# Patient Record
Sex: Male | Born: 1970 | Hispanic: No | Smoking: Never smoker
Health system: Southern US, Community
[De-identification: ages and names within clinical notes are randomized; demographics above are authoritative.]

## PROBLEM LIST (undated history)

## (undated) DIAGNOSIS — B159 Hepatitis A without hepatic coma: Secondary | ICD-10-CM

## (undated) DIAGNOSIS — J189 Pneumonia, unspecified organism: Secondary | ICD-10-CM

## (undated) DIAGNOSIS — G4730 Sleep apnea, unspecified: Secondary | ICD-10-CM

## (undated) DIAGNOSIS — F329 Major depressive disorder, single episode, unspecified: Secondary | ICD-10-CM

## (undated) DIAGNOSIS — F32A Depression, unspecified: Secondary | ICD-10-CM

## (undated) DIAGNOSIS — J1000 Influenza due to other identified influenza virus with unspecified type of pneumonia: Secondary | ICD-10-CM

## (undated) HISTORY — PX: OTHER SURGICAL HISTORY: SHX169

---

## 2016-04-20 DIAGNOSIS — B159 Hepatitis A without hepatic coma: Secondary | ICD-10-CM

## 2016-04-20 HISTORY — DX: Hepatitis a without hepatic coma: B15.9

## 2016-05-21 DIAGNOSIS — J189 Pneumonia, unspecified organism: Secondary | ICD-10-CM

## 2016-05-21 HISTORY — DX: Pneumonia, unspecified organism: J18.9

## 2016-06-12 ENCOUNTER — Emergency Department (HOSPITAL_COMMUNITY)
Admission: EM | Admit: 2016-06-12 | Discharge: 2016-06-13 | Disposition: A | Discharge status: Other Acute Inpatient Hospital | Payer: 59 | Attending: Emergency Medicine | Admitting: Emergency Medicine

## 2016-06-12 ENCOUNTER — Encounter (HOSPITAL_COMMUNITY): Payer: Self-pay | Admitting: Emergency Medicine

## 2016-06-12 DIAGNOSIS — Z5181 Encounter for therapeutic drug level monitoring: Secondary | ICD-10-CM | POA: Insufficient documentation

## 2016-06-12 DIAGNOSIS — R109 Unspecified abdominal pain: Secondary | ICD-10-CM | POA: Insufficient documentation

## 2016-06-12 DIAGNOSIS — Z87891 Personal history of nicotine dependence: Secondary | ICD-10-CM | POA: Diagnosis not present

## 2016-06-12 DIAGNOSIS — R45851 Suicidal ideations: Secondary | ICD-10-CM

## 2016-06-12 DIAGNOSIS — F329 Major depressive disorder, single episode, unspecified: Secondary | ICD-10-CM | POA: Insufficient documentation

## 2016-06-12 HISTORY — DX: Hepatitis a without hepatic coma: B15.9

## 2016-06-12 LAB — URINALYSIS, ROUTINE W REFLEX MICROSCOPIC
BILIRUBIN URINE: NEGATIVE
Glucose, UA: NEGATIVE mg/dL
HGB URINE DIPSTICK: NEGATIVE
Ketones, ur: NEGATIVE mg/dL
Leukocytes, UA: NEGATIVE
Nitrite: NEGATIVE
Protein, ur: NEGATIVE mg/dL
SPECIFIC GRAVITY, URINE: 1.016 (ref 1.005–1.030)
pH: 6 (ref 5.0–8.0)

## 2016-06-12 LAB — RAPID URINE DRUG SCREEN, HOSP PERFORMED
AMPHETAMINES: NOT DETECTED
Barbiturates: NOT DETECTED
Benzodiazepines: NOT DETECTED
Cocaine: NOT DETECTED
Opiates: NOT DETECTED
TETRAHYDROCANNABINOL: NOT DETECTED

## 2016-06-12 LAB — ACETAMINOPHEN LEVEL: Acetaminophen (Tylenol), Serum: <10 ug/mL — ABNORMAL LOW (ref 10–30)

## 2016-06-12 LAB — ETHANOL: Alcohol, Ethyl (B): <5 mg/dL (ref <5)

## 2016-06-12 LAB — CBC
HEMATOCRIT: 46.8 % (ref 39.0–52.0)
HEMOGLOBIN: 15.8 g/dL (ref 13.0–17.0)
MCH: 29 pg (ref 26.0–34.0)
MCHC: 33.8 g/dL (ref 30.0–36.0)
MCV: 85.9 fL (ref 78.0–100.0)
Platelets: 282 10*3/uL (ref 150–400)
RBC: 5.45 MIL/uL (ref 4.22–5.81)
RDW: 13.8 % (ref 11.5–15.5)
WBC: 9.1 10*3/uL (ref 4.0–10.5)

## 2016-06-12 LAB — SALICYLATE LEVEL: Salicylate Lvl: <7 mg/dL (ref 2.8–30.0)

## 2016-06-12 LAB — COMPREHENSIVE METABOLIC PANEL
ALT: 110 U/L — AB (ref 17–63)
AST: 52 U/L — AB (ref 15–41)
Albumin: 4.1 g/dL (ref 3.5–5.0)
Alkaline Phosphatase: 48 U/L (ref 38–126)
Anion gap: 9 (ref 5–15)
BUN: 13 mg/dL (ref 6–20)
CHLORIDE: 103 mmol/L (ref 101–111)
CO2: 24 mmol/L (ref 22–32)
CREATININE: 0.93 mg/dL (ref 0.61–1.24)
Calcium: 9.5 mg/dL (ref 8.9–10.3)
GFR calc Af Amer: >60 mL/min (ref >60)
GFR calc non Af Amer: >60 mL/min (ref >60)
Glucose, Bld: 168 mg/dL — ABNORMAL HIGH (ref 65–99)
Potassium: 4.2 mmol/L (ref 3.5–5.1)
SODIUM: 136 mmol/L (ref 135–145)
Total Bilirubin: 0.8 mg/dL (ref 0.3–1.2)
Total Protein: 7.2 g/dL (ref 6.5–8.1)

## 2016-06-12 LAB — LIPASE, BLOOD: LIPASE: 31 U/L (ref 11–51)

## 2016-06-12 NOTE — ED Notes (Signed)
Called for sitter ?

## 2016-06-12 NOTE — ED Triage Notes (Signed)
Pt presents to ED for assessment of right sided upper abdominal ache 4/10 starting yesterday.  Patient returned from a business trip from [place] a month ago, was diagnosed with asian flu, then he had pneumonia, followed by a  CT angio to r/o PE which was negative but found he had an enlarged liver.  Pt diagnosed with Hep A.  Being treated by his PCP, but started to have pain and noted his skin was turning more yellow the past two days.  Patient, during screenings, also admitted to have some SI, as he also recently got divorced.

## 2016-06-13 ENCOUNTER — Inpatient Hospital Stay (HOSPITAL_COMMUNITY)
Admission: AD | Admit: 2016-06-13 | Discharge: 2016-06-16 | DRG: 885 | Disposition: A | Discharge status: Home / Self Care | Payer: 59 | Source: Intra-hospital | Attending: Psychiatry | Admitting: Psychiatry

## 2016-06-13 ENCOUNTER — Encounter (HOSPITAL_COMMUNITY): Payer: Self-pay | Admitting: *Deleted

## 2016-06-13 ENCOUNTER — Emergency Department (HOSPITAL_COMMUNITY): Payer: 59

## 2016-06-13 DIAGNOSIS — B159 Hepatitis A without hepatic coma: Secondary | ICD-10-CM | POA: Diagnosis present

## 2016-06-13 DIAGNOSIS — F332 Major depressive disorder, recurrent severe without psychotic features: Secondary | ICD-10-CM | POA: Diagnosis present

## 2016-06-13 DIAGNOSIS — Z79899 Other long term (current) drug therapy: Secondary | ICD-10-CM | POA: Diagnosis not present

## 2016-06-13 DIAGNOSIS — F329 Major depressive disorder, single episode, unspecified: Secondary | ICD-10-CM | POA: Diagnosis not present

## 2016-06-13 DIAGNOSIS — G4730 Sleep apnea, unspecified: Secondary | ICD-10-CM | POA: Diagnosis present

## 2016-06-13 DIAGNOSIS — Z87891 Personal history of nicotine dependence: Secondary | ICD-10-CM | POA: Diagnosis not present

## 2016-06-13 HISTORY — DX: Sleep apnea, unspecified: G47.30

## 2016-06-13 HISTORY — DX: Depression, unspecified: F32.A

## 2016-06-13 HISTORY — DX: Influenza due to other identified influenza virus with unspecified type of pneumonia: J10.00

## 2016-06-13 HISTORY — DX: Pneumonia, unspecified organism: J18.9

## 2016-06-13 HISTORY — DX: Major depressive disorder, single episode, unspecified: F32.9

## 2016-06-13 MED ORDER — ALUM & MAG HYDROXIDE-SIMETH 200-200-20 MG/5ML PO SUSP
30.0000 mL | ORAL | Status: DC | PRN
  Administered 2016-06-13: 30 mL via ORAL
  Filled 2016-06-13: qty 30

## 2016-06-13 MED ORDER — IBUPROFEN 400 MG PO TABS: 600.0000 mg | ORAL_TABLET | Freq: Three times a day (TID) | ORAL | Status: DC | PRN

## 2016-06-13 MED ORDER — ZOLPIDEM TARTRATE 5 MG PO TABS: 5.0000 mg | ORAL_TABLET | Freq: Every evening | ORAL | Status: DC | PRN

## 2016-06-13 MED ORDER — BUPROPION HCL ER (XL) 150 MG PO TB24
300.0000 mg | ORAL_TABLET | Freq: Every day | ORAL | Status: DC
  Administered 2016-06-13: 300 mg via ORAL
  Filled 2016-06-13: qty 2

## 2016-06-13 MED ORDER — ACETAMINOPHEN 325 MG PO TABS: 650.0000 mg | ORAL_TABLET | Freq: Four times a day (QID) | ORAL | Status: DC | PRN

## 2016-06-13 MED ORDER — BUPROPION HCL ER (XL) 300 MG PO TB24
300.0000 mg | ORAL_TABLET | Freq: Every day | ORAL | Status: DC
  Administered 2016-06-14: 300 mg via ORAL
  Filled 2016-06-13 (×3): qty 1

## 2016-06-13 MED ORDER — PREDNISONE 10 MG PO TABS
10.0000 mg | ORAL_TABLET | Freq: Three times a day (TID) | ORAL | Status: AC
  Administered 2016-06-14 (×3): 10 mg via ORAL
  Filled 2016-06-13: qty 2
  Filled 2016-06-13 (×2): qty 1
  Filled 2016-06-13: qty 4
  Filled 2016-06-13: qty 1

## 2016-06-13 MED ORDER — MAGNESIUM HYDROXIDE 400 MG/5ML PO SUSP
30.0000 mL | Freq: Every day | ORAL | Status: DC | PRN
  Administered 2016-06-15: 30 mL via ORAL
  Filled 2016-06-13: qty 30

## 2016-06-13 MED ORDER — TRAZODONE HCL 50 MG PO TABS
50.0000 mg | ORAL_TABLET | Freq: Every evening | ORAL | Status: DC | PRN
  Administered 2016-06-13: 50 mg via ORAL
  Filled 2016-06-13 (×2): qty 1

## 2016-06-13 MED ORDER — ALUM & MAG HYDROXIDE-SIMETH 200-200-20 MG/5ML PO SUSP: 30.0000 mL | ORAL | Status: DC | PRN

## 2016-06-13 MED ORDER — PREDNISONE 20 MG PO TABS
20.0000 mg | ORAL_TABLET | Freq: Once | ORAL | Status: AC
  Administered 2016-06-13: 20 mg via ORAL
  Filled 2016-06-13: qty 1

## 2016-06-13 MED ORDER — PREDNISONE 10 MG PO TABS
10.0000 mg | ORAL_TABLET | Freq: Two times a day (BID) | ORAL | Status: AC
Start: 2016-06-15 — End: 2016-06-15
  Administered 2016-06-15 (×2): 10 mg via ORAL
  Filled 2016-06-13 (×2): qty 1

## 2016-06-13 MED ORDER — PREDNISONE 10 MG PO TABS
10.0000 mg | ORAL_TABLET | Freq: Every day | ORAL | Status: DC
  Administered 2016-06-16: 10 mg via ORAL
  Filled 2016-06-13 (×3): qty 1

## 2016-06-13 MED ORDER — ONDANSETRON HCL 4 MG PO TABS: 4.0000 mg | ORAL_TABLET | Freq: Three times a day (TID) | ORAL | Status: DC | PRN
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
## 2016-06-13 NOTE — ED Notes (Signed)
Pt aware of tx plan - accepted to [facility] - signed consent forms - copy faxed to [facility], copy sent to Medical Records, and original placed in envelope for [facility].

## 2016-06-13 NOTE — ED Notes (Signed)
Pt talking w/a lady named, Rosann Auerbachrish, on phone at nurses' desk.

## 2016-06-13 NOTE — ED Notes (Signed)
States he feels safe to go home. States will sign "No Harm Safety Contract" and feels safe to follow up w/outpt tx - if providers agree to allow him to be d/c'd. States he did cut his wrists a few months ago after moving to this area "for attention". States did not seek tx. States he is planning to move to [place] soon so may be closer to his daughters. States he works for [employer] and they had moved him from his job in [place] to  a job at [employer] in [place] and he travels often. States came to ED because he was concerned about upper right abd pain d/t recently diagnosed w/Hep A - which he says he got from [place] along w/"Asian flu" and pneumonia.

## 2016-06-13 NOTE — ED Notes (Signed)
Taken to xray.

## 2016-06-13 NOTE — Plan of Care (Signed)
Problem: Safety: Goal: Periods of time without injury will increase Outcome: Progressing Patient contracts for safety on the unit. Patient on q15 min safety checks.

## 2016-06-13 NOTE — ED Notes (Signed)
Pt's belongings x 1 labeled personal black bag placed at nurses' desk for inventory. Pt's cell phone placed in bag.

## 2016-06-13 NOTE — Progress Notes (Signed)
Received call [facility] re: [facility] of [facility] and need for precautions. Per ED provider note it has been a month since patient returned from [place], began to feel ill and was diagnosed with a "fllu" possibly "Asian" flu per patient. He subsequently continued to cough and was diagnosed with pneumonia. Has finished a course of antibiotics, no respiratory symptoms, no fever today. He presents with abdominal pain and suicidal thoughts. From an IP standpoint I do not see indications for isolation precautions.

## 2016-06-13 NOTE — Tx Team (Signed)
Initial Treatment Plan 06/13/2016 4:37 PM Arthur Cruz Broda [id number]    PATIENT STRESSORS: Financial difficulties Health problems Marital or family conflict Traumatic event   PATIENT STRENGTHS: Average or above average intelligence Capable of independent living Financial means Supportive family/friends Work skills   PATIENT IDENTIFIED PROBLEMS: Recent loss of marriage  Family conflict  Dx of illness due international travel  Hx of suicide attempt  "feelings of hopelessness"  "not sleeping/worrying"  Suicidal risk         DISCHARGE CRITERIA:  Improved stabilization in mood, thinking, and/or behavior Medical problems require only outpatient monitoring Motivation to continue treatment in a less acute level of care Need for constant or close observation no longer present Verbal commitment to aftercare and medication compliance  PRELIMINARY DISCHARGE PLAN: Outpatient therapy Return to previous living arrangement Return to previous work or school arrangements  PATIENT/FAMILY INVOLVEMENT: This treatment plan has been presented to and reviewed with the patient, Arthur Cruz Salim.  The patient and family have been given the opportunity to ask questions and make suggestions.  Cranford MonBeaudry, Lianette Broussard Evans, RN 06/13/2016, 4:37 PM

## 2016-06-13 NOTE — Progress Notes (Signed)
[facility]. Writer will continue to follow up with placement efforts.  Melbourne Abtsatia Brynlee Pennywell, LCSWA Disposition staff 06/13/2016 10:45 AM

## 2016-06-13 NOTE — ED Notes (Signed)
Pt on phone at nurses' desk w/Trish - advising her he is going [facility].
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
## 2016-06-13 NOTE — ED Provider Notes (Signed)
[facility] DEPT Provider Note   CSN: [id number] Arrival date & time: 06/12/16  2137     History   Chief Complaint Chief Complaint  Patient presents with  . Abdominal Pain  . Suicidal    HPI Arthur Cruz is a 46 y.o. male.  The history is provided by the patient and medical records.    46 year old male here with abdominal pain.  Patient reports about a month ago he went on a business trip to [place] and once he returned he began feeling ill. He was seen and diagnosed with a flu.  States afterwards he continued coughing, was then diagnosed with pneumonia. He states he completed antibiotics and had a CT scan of his chest which was negative for PE and revealed that the pneumonia had cleared. States on the CT scan he was told that his liver was enlarged. He was tested for hepatitis and was found to be positive for hepatitis A. States he is currently under treatment for this by his primary care doctor, but noticed increasing pain over the past 2 days. Pain is localized to his right upper quadrant, described as a dull, aching pain. He reports it does seem worse after eating but is not specific to any type of food. He denies any nausea or vomiting. Bowel movements have been normal, has actually been going more than normal but has not had frank diarrhea. Denies any fever or chills. No new sick contacts.  Patient also voiced during triage that he was having some suicidal thoughts. Reports about 2 years ago his wife at the time was diagnosed with a large pancreatic tumor and was in treatment for about 2 years to have this removed with follow-up testing. States after she finished her treatment was cleared, she requested a divorce. Patient reports his been somewhat difficult as they have been in divorce settlement for about 9 months now. He does have a 9 year old daughter who currently lives with his ex-wife in [place]. He reports he does not get to see her very often and she has not been  handling divorce well. She has been in out of psychiatric facilities about 3 times in the past 6 months. He reports family members have blamed him for this. Reports he is currently in [place] along, he does not have any close family or friends here. States lately he has just felt like "giving up". States he did slit his wrist about 7 months ago, no recent self mutilation. He denies any specific plan of suicide at this time. He denies any homicidal ideation. No hallucinations. Denies any illicit drug or alcohol abuse recently.  States he does take Wellbutrin for depression, states he feels like it used to help him but not so much anymore.  Past Medical History:  Diagnosis Date  . Hepatitis A     There are no active problems to display for this patient.   No past surgical history on file.     Home Medications    Prior to Admission medications   Not on File    Family History No family history on file.  Social History Social History  Substance Use Topics  . Smoking status: Former Games developer  . Smokeless tobacco: Never Used  . Alcohol use Yes     Allergies   Patient has no allergy information on record.   Review of Systems Review of Systems  Gastrointestinal: Positive for abdominal pain.  Psychiatric/Behavioral: Positive for suicidal ideas.  All other systems reviewed and are negative.  Physical Exam Updated Vital Signs BP (!) 187/111 (BP Location: Left Arm)   Pulse 95   Temp 98.5 F (36.9 C) (Oral)   Resp 18   SpO2 98%   Physical Exam  Constitutional: He is oriented to person, place, and time. He appears well-developed and well-nourished.  HENT:  Head: Normocephalic and atraumatic.  Mouth/Throat: Oropharynx is clear and moist.  Eyes: Conjunctivae and EOM are normal. Pupils are equal, round, and reactive to light.  Neck: Normal range of motion.  Cardiovascular: Normal rate, regular rhythm and normal heart sounds.   Pulmonary/Chest: Effort normal and  breath sounds normal. No respiratory distress. He has no wheezes.  Abdominal: Soft. Bowel sounds are normal. There is tenderness. There is negative Murphy's sign.    Mild tenderness in the right upper quadrant without Murphy's sign, bowel sounds are normal, no peritonitis  Musculoskeletal: Normal range of motion.  Neurological: He is alert and oriented to person, place, and time.  Skin: Skin is warm and dry.  Psychiatric: He is not actively hallucinating. He exhibits a depressed mood. He expresses suicidal ideation. He expresses no homicidal ideation. He expresses no homicidal plans.  Appears depressed, tearful when discussing his divorce SI without plan, denies HI or AVH  Nursing note and vitals reviewed.    ED Treatments / Results  Labs (all labs ordered are listed, but only abnormal results are displayed) Labs Reviewed  COMPREHENSIVE METABOLIC PANEL - Abnormal; Notable for the following:       Result Value   Glucose, Bld 168 (*)    AST 52 (*)    ALT 110 (*)    All other components within normal limits  ACETAMINOPHEN LEVEL - Abnormal; Notable for the following:    Acetaminophen (Tylenol), Serum <10 (*)    All other components within normal limits  ETHANOL  SALICYLATE LEVEL  CBC  RAPID URINE DRUG SCREEN, HOSP PERFORMED  LIPASE, BLOOD  URINALYSIS, ROUTINE W REFLEX MICROSCOPIC    EKG  EKG Interpretation None       Radiology Dg Abd Acute W/chest  Result Date: 06/13/2016 CLINICAL DATA:  46 year old male with abdominal pain. EXAM: DG ABDOMEN ACUTE W/ 1V CHEST COMPARISON:  Chest Radiograph dated 06/10/2016 FINDINGS: The lungs are clear. There is no pleural effusion or pneumothorax. The cardiac silhouette is within normal limits. There is moderate stool throughout the colon. No bowel dilatation or evidence of obstruction. No free air or radiopaque calculi identified. The visualized osseous structures and the soft tissues appear unremarkable. IMPRESSION: 1. No acute  cardiopulmonary process. 2. Constipation.  No bowel obstruction. Electronically Signed   By: Elgie Collard M.D.   On: 06/13/2016 01:46   US Abdomen Limited Ruq  Result Date: 06/13/2016 CLINICAL DATA:  Initial evaluation for acute abdominal pain for 3 days, elevated LFTs. History of reflux disease, hepatitis today. EXAM: US ABDOMEN LIMITED - RIGHT UPPER QUADRANT COMPARISON:  Prior radiograph from earlier the same day. FINDINGS: Gallbladder: Gallbladder was contracted, limiting evaluation. No echogenic stones or definite internal sludge. Gallbladder wall measure within normal limits at 2.5 mm. No free pericholecystic fluid. No sonographic Murphy sign elicited on exam. Common bile duct: Diameter: 3 mm Liver: No focal lesion identified. Diffusely increased echogenicity suggestive of steatosis. Probable focal fatty sparing noted adjacent to the gallbladder fossa. IMPRESSION: 1. Negative right upper quadrant ultrasound. No sonographic evidence for cholelithiasis, acute cholecystitis, or biliary dilatation. 2. Hepatic steatosis. Electronically Signed   By: Rise Mu M.D.   On: 06/13/2016 02:37  Procedures Procedures (including critical care time)  Medications Ordered in ED Medications  alum & mag hydroxide-simeth (MAALOX/MYLANTA) 200-200-20 MG/5ML suspension 30 mL (not administered)  ondansetron (ZOFRAN) tablet 4 mg (not administered)  zolpidem (AMBIEN) tablet 5 mg (not administered)  ibuprofen (ADVIL,MOTRIN) tablet 600 mg (not administered)  buPROPion (WELLBUTRIN XL) 24 hr tablet 300 mg (not administered)     Initial Impression / Assessment and Plan / ED Course  I have reviewed the triage vital signs and the nursing notes.  Pertinent labs & imaging results that were available during my care of the patient were reviewed by me and considered in my medical decision making (see chart for details).  46 year old male here with abdominal pain.  Recent diagnosis of hepatitis A after  traveling to [place], however now with worsening right upper quadrant pain. He is afebrile and nontoxic. He has some mild tenderness of his right upper quadrant without Murphy's sign.  No rebound or guarding, no peritoneal signs. Labwork obtained from triage, mild elevation of the LFTs, normal bili and alkaline phosphatase. Normal white count. UDS and ethanol are normal. Tylenol and salicylate levels are also normal.  Ultrasound of right upper quadrant was obtained-- evidence of fatty liver, however no evidence of gallstones or pericholecystic fluid.  Acute abd series with evidence of constipation but no obstruction.  Patient's elevated LFTs may be sequela of his hepatitis A.  No N/V/D here. Feel this can be monitored further with his PCP.  Recommended repeat labs in about 1-2 weeks to ensure LFT's have normalized.  Patient voiced understanding of this.  Patient also with suicidal ideation. Multiple stressing factors in his personal life and job. Reports suicide attempt 7 months ago, none recently.  Denies any homicidal ideation. No hallucinations.  TTS has evaluated patient, recommends IP placement.  Patient is agreeable to this.  Holding orders have been placed, I have ordered his home meds.  Final Clinical Impressions(s) / ED Diagnoses   Final diagnoses:  Abdominal pain  Suicidal ideation    New Prescriptions New Prescriptions   No medications on file     Garlon HatchetLisa M Greggory Safranek, PA-C 06/13/16 0529    Layla MawKristen N Ward, DO 06/13/16 0532

## 2016-06-13 NOTE — Progress Notes (Signed)
Nursing Progress Note 7p-7a  D) Patient presents with depressed affect. Patient stays in bed for most of shift. Patient isolative to his room and reports that he "feels tired", stating "I didn't sleep yesterday". Patient states "I want to go home, I don't think I should be here". Patient denies SI/HI or AVH and laughs when writer asks the questions. Patient complains of mild abdmonial pain but denies wanting medicine or a heat/ice pack. Patient contracts for safety at this time. Patient forwards little and is minimal with Clinical research associatewriter.  A) Emotional support given. 1:1 interaction and active listening provided. Patient medicated with PRN orders as prescribed. Medications reviewed with patient. Patient verbalized understanding of medications without further questions. Opportunity for snacks and fluids provided. Opportunities for questions or concerns presented to patient. Patient encouraged to continue to work on treatment goals. Labs, vital signs and patient behavior monitored throughout shift. Patient safety maintained with q15 min safety checks. Hand hygiene and infection control precautions reviewed with patient this evening. Patient verbalized understanding.  R) Patient receptive to interaction with nurse. Patient remains safe on the unit at this time. Patient denies any adverse medication reactions at this time. Patient is resting in bed without complaints. Will continue to monitor.

## 2016-06-13 NOTE — ED Notes (Signed)
Pt sleeping on bed w/eyes closed. Respirations even, unlabored. Snorous respirations noted intermittently. Will administer 1000 med when pt awakens.

## 2016-06-14 DIAGNOSIS — Z87891 Personal history of nicotine dependence: Secondary | ICD-10-CM

## 2016-06-14 DIAGNOSIS — F332 Major depressive disorder, recurrent severe without psychotic features: Principal | ICD-10-CM

## 2016-06-14 DIAGNOSIS — Z79899 Other long term (current) drug therapy: Secondary | ICD-10-CM

## 2016-06-14 LAB — LIPID PANEL
CHOL/HDL RATIO: 5.8 ratio
CHOLESTEROL: 302 mg/dL — AB (ref 0–200)
HDL: 52 mg/dL (ref >40)
LDL Cholesterol: 202 mg/dL — ABNORMAL HIGH (ref 0–99)
TRIGLYCERIDES: 238 mg/dL — AB (ref <150)
VLDL: 48 mg/dL — AB (ref 0–40)

## 2016-06-14 LAB — TSH: TSH: 1.055 u[IU]/mL (ref 0.350–4.500)

## 2016-06-14 MED ORDER — IBUPROFEN 400 MG PO TABS
400.0000 mg | ORAL_TABLET | Freq: Once | ORAL | Status: AC
  Administered 2016-06-14: 400 mg via ORAL
  Filled 2016-06-14 (×2): qty 1

## 2016-06-14 MED ORDER — TRAZODONE HCL 50 MG PO TABS
50.0000 mg | ORAL_TABLET | Freq: Every evening | ORAL | Status: DC | PRN
  Administered 2016-06-14 – 2016-06-15 (×4): 50 mg via ORAL
  Filled 2016-06-14 (×9): qty 1

## 2016-06-14 MED ORDER — BUPROPION HCL ER (XL) 150 MG PO TB24
450.0000 mg | ORAL_TABLET | Freq: Every day | ORAL | Status: DC
  Administered 2016-06-15 – 2016-06-16 (×2): 450 mg via ORAL
  Filled 2016-06-14 (×4): qty 3

## 2016-06-14 MED ORDER — TRAZODONE HCL 50 MG PO TABS
50.0000 mg | ORAL_TABLET | Freq: Once | ORAL | Status: AC
  Administered 2016-06-14: 50 mg via ORAL
  Filled 2016-06-14: qty 1

## 2016-06-14 NOTE — [facility] Group Notes (Signed)
[facility] Group Notes:  (Nursing/MHT/Case Management/Adjunct)  Date:  06/14/2016  Time:  0900 am   Type of Therapy:  Psychoeducational Skills  Participation Level:  Active  Participation Quality:  Appropriate and Attentive  Affect:  Appropriate  Cognitive:  Alert and Appropriate  Insight:  Lacking  Engagement in Group:  Engaged  Modes of Intervention:  Support  Summary of Progress/Problems: Patient is focused on when he can "see the doctor and be discharged."  Patient feels that he does not need to be here.  Also, patient asked if his keys to his apartment could be given to his friend that will be visiting this afternoon.  Informed patient that would be possible.    Arthur Cruz, Arthur Cruz 06/14/2016, 9:36 AM

## 2016-06-14 NOTE — Plan of Care (Signed)
Problem: Self-Concept: Goal: Ability to disclose and discuss suicidal ideas will improve Outcome: Progressing Patient is able to express himself openly with staff regarding past suicidal thoughts.  He denies any self harm thoughts today.
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
## 2016-06-14 NOTE — Progress Notes (Signed)
Patient attended wrap-up group and said that his day was a 7. His coping skill was talking to others and music.

## 2016-06-14 NOTE — Progress Notes (Signed)
Nursing Progress Note 7p-7a  D) Patient presents pleasant and cooperative. Patient visiting with male tonight. Patient states "I am looking forward to going home". Patient is seen interactive in the milieu and attended group. Patient denies SI/HI/AVH but endorses mild generalized pain and abdominal pain. Patient requesting ibuprofen. Patient requesting a higher dose of trazodone stating "the extra dose really helped me last night". Patient contracts for safety on the unit.   A) Emotional support given. 1:1 interaction and active listening provided. NP Nira ConnJason Berry notified and new orders received. Patient medicated with PM orders as prescribed. Medications reviewed with patient. Patient verbalized understanding of medications without further questions.  Snacks and fluids provided. Opportunities for questions or concerns presented to patient. Patient encouraged to continue to work on treatment goals. Labs, vital signs and patient behavior monitored throughout shift. Patient safety maintained with q15 min safety checks.  R) Patient receptive to interaction with nurse. Patient remains safe on the unit at this time. Patient denies any adverse medication reactions at this time. Patient is resting in bed without complaints. Will continue to monitor.

## 2016-06-14 NOTE — Plan of Care (Signed)
Problem: Activity: Goal: Sleeping patterns will improve Outcome: Progressing Patient utilizing sleep medications and reporting "they help me sleep". Patient slept 6 hours last night.

## 2016-06-14 NOTE — H&P (Signed)
Psychiatric Admission Assessment Adult  Patient Identification: Arthur Cruz MRN:  [id number] Date of Evaluation:  06/14/2016 Chief Complaint:   " I have been through a lot " Principal Diagnosis:  Major Depression, Recurrent, no Psychotic Features  Diagnosis:   Patient Active Problem List   Diagnosis Date Noted  . MDD (major depressive disorder), recurrent severe, without psychosis (HCC) [F33.2] 06/13/2016   History of Present Illness: 46 year old male. States he has been under a lot of stress over recent weeks, months. Stressors include recent divorce, which resulted in significant financial stress, heavy work load, with a lot of travelling overseas, and recent infectious/  medical illnesses contracted while travelling - influenza, hepatitis A. He states he went to ED due to concerns that he had been taking Tylenol for pain and realized it could be hepatotoxic on top of recently diagnosed Hepatitis A.  In ED patient reported that he had suicidal ideations, reported thoughts of self cutting . Patient states he has not had suicidal ideations recently , but states that he has felt overwhelmed by stressors, and has had passive SI such as thinking " what's the use of it". States he did cut self on wrist 8 months ago ( at the time did not seek treatment )  Of note, he was recently started on steroid course - 4 days ago. He does not feel that this has worsened his mood /affect.   Associated Signs/Symptoms: Depression Symptoms:  depressed mood, suicidal thoughts without plan, loss of energy/fatigue, increased appetite, weight gain ( 30 lbs over the last year )  (Hypo) Manic Symptoms:  Denies  Anxiety Symptoms:   Reports feeling anxious related to stressors, and has had occasional panic attacks  Psychotic Symptoms: denies  PTSD Symptoms: Denies  Total Time spent with patient: 45 minutes  Past Psychiatric History: no prior psychiatric admissions,denies history of suicide attempts other  than suicide gesture by superficially cutting wrist 8 months ago, states he does feel he is prone to depressive episodes, and has had episodes of depression since he was a teenager. Denies history of mania, denies history of psychosis, denies history of PTSD.  History of self cutting as a teenager, but stopped many years ago.   Is the patient at risk to self? Yes.    Has the patient been a risk to self in the past 6 months? Yes.    Has the patient been a risk to self within the distant past? No.  Is the patient a risk to others? No.  Has the patient been a risk to others in the past 6 months? No.  Has the patient been a risk to others within the distant past? No.   Prior Inpatient Therapy:  as above Prior Outpatient Therapy:   medications are prescribed by PCP, does not have a therapist .   Alcohol Screening: 1. How often do you have a drink containing alcohol?: 2 to 4 times a month 2. How many drinks containing alcohol do you have on a typical day when you are drinking?: 1 or 2 3. How often do you have six or more drinks on one occasion?: Never Preliminary Score: 0 9. Have you or someone else been injured as a result of your drinking?: No 10. Has a relative or friend or a doctor or another health worker been concerned about your drinking or suggested you cut down?: No Alcohol Use Disorder Identification Test Final Score (AUDIT): 2 Brief Intervention: AUDIT score less than 7 or less-screening does not  suggest unhealthy drinking-brief intervention not indicated Substance Abuse History in the last 12 months:  Denies alcohol or drug abuse  Consequences of Substance Abuse: Denies  Previous Psychotropic Medications: he is on Wellbutrin - states he has been on this medication x 3 years. In the past he had tried Prozac but did not tolerate it well . Psychological Evaluations:  No  Past Medical History:  Past Medical History:  Diagnosis Date  . Depression   . Flu due to oth ident flu virus w  unsp type of pneumonia    within last 5 weeks/medically cleared  . Hepatitis A    Recent diagnosis (within 5 weeks)  . Medical history non-contributory   . Pneumonia    within last 5 weeks/finished antibiotics  . Sleep apnea    recent diagnosis   History reviewed. No pertinent surgical history. Family History:  Parents are divorced, live in [place], has one sister Family Psychiatric  History: teenaged daughter has been hospitalized in the past for self cutting and suicidal ideations  Tobacco Screening: Have you used any form of tobacco in the last 30 days? (Cigarettes, Smokeless Tobacco, Cigars, and/or Pipes): No Social History: he is originally from [place], but has been in [place] for years, divorced , which was finalized 1/12, after a 62 year marriage, has two children ( 14,15) who are with mother , live in [place]. At this time lives alone, no current relationship, denies legal issues .  History  Alcohol Use  . 1.2 - 1.8 oz/week  . 2 - 3 Glasses of wine per week    Comment: 1/2 bottle weekly     History  Drug Use No    Additional Social History: Marital status: Divorced Divorced, when?: January 2018 What types of issues is patient dealing with in the relationship?: Was with wife 25 years, although not married the entire time.  She was his best friend, and now is his worst enemy.  She never worked, and he was always the provider. Does patient have children?: Yes How many children?: 2 How is patient's relationship with their children?: 14yo and 15yo daughters - blaming him for divorce because he moved out, even though their mother asked him to.  Oldest has had 2 suicide attempts, has been hospitalized 3 times.  Allergies:  No Known Allergies Lab Results:  Results for orders placed or performed during the hospital encounter of 06/13/16 (from the past 48 hour(s))  Lipid panel     Status: Abnormal   Collection Time: 06/14/16  6:00 AM  Result Value Ref Range   Cholesterol 302 (H) 0 -  200 mg/dL   Triglycerides 161 (H) <150 mg/dL   HDL 52 >09 mg/dL   Total CHOL/HDL Ratio 5.8 RATIO   VLDL 48 (H) 0 - 40 mg/dL   LDL Cholesterol 604 (H) 0 - 99 mg/dL    Comment:        Total Cholesterol/HDL:CHD Risk Coronary Heart Disease Risk Table                     Men   Women  1/2 Average Risk   3.4   3.3  Average Risk       5.0   4.4  2 X Average Risk   9.6   7.1  3 X Average Risk  23.4   11.0        Use the calculated Patient Ratio above and the CHD Risk Table to determine the patient's CHD Risk.  ATP III CLASSIFICATION (LDL):  <100     mg/dL   Optimal  [id number]  mg/dL   Near or Above                    Optimal  130-159  mg/dL   Borderline  [id number]  mg/dL   High  >811>190     mg/dL   Very High Performed at [facility], [address] [place], [place] [address]   TSH     Status: None   Collection Time: 06/14/16  6:00 AM  Result Value Ref Range   TSH 1.055 0.350 - 4.500 uIU/mL    Comment: Performed by a 3rd Generation assay with a functional sensitivity of <=0.01 uIU/mL. Performed at [facility], [address] [place], [place] [address]     Blood Alcohol level:  Lab Results  Component Value Date   ETH <5 06/12/2016    Metabolic Disorder Labs:  No results found for: HGBA1C, MPG No results found for: PROLACTIN Lab Results  Component Value Date   CHOL 302 (H) 06/14/2016   TRIG 238 (H) 06/14/2016   HDL 52 06/14/2016   CHOLHDL 5.8 06/14/2016   VLDL 48 (H) 06/14/2016   LDLCALC 202 (H) 06/14/2016    Current Medications: Current Facility-Administered Medications  Medication Dose Route Frequency Provider Last Rate Last Dose  . alum & mag hydroxide-simeth (MAALOX/MYLANTA) 200-200-20 MG/5ML suspension 30 mL  30 mL Oral Q4H PRN Beau FannyJohn C Withrow, FNP      . buPROPion (WELLBUTRIN XL) 24 hr tablet 300 mg  300 mg Oral Daily Beau FannyJohn C Withrow, FNP   300 mg at 06/14/16 0747  . magnesium hydroxide (MILK OF MAGNESIA) suspension 30 mL  30 mL Oral  Daily PRN Beau FannyJohn C Withrow, FNP      . predniSONE (DELTASONE) tablet 10 mg  10 mg Oral TID WC Beau FannyJohn C Withrow, FNP   10 mg at 06/14/16 1131  . [START ON 06/15/2016] predniSONE (DELTASONE) tablet 10 mg  10 mg Oral BID WC Beau FannyJohn C Withrow, FNP      . [START ON 06/16/2016] predniSONE (DELTASONE) tablet 10 mg  10 mg Oral Q breakfast Beau FannyJohn C Withrow, FNP      . traZODone (DESYREL) tablet 50 mg  50 mg Oral QHS PRN Beau FannyJohn C Withrow, FNP   50 mg at 06/13/16 2123   PTA Medications: Prescriptions Prior to Admission  Medication Sig Dispense Refill Last Dose  . buPROPion (WELLBUTRIN XL) 300 MG 24 hr tablet Take 1 tablet by mouth daily.  1   . predniSONE (DELTASONE) 10 MG tablet Take 10 mg by mouth daily.  0     Musculoskeletal: Strength & Muscle Tone: within normal limits Gait & Station: normal Patient leans: N/A  Psychiatric Specialty Exam: Physical Exam  Review of Systems  Constitutional: Negative.   HENT: Negative.   Eyes: Negative.   Respiratory: Negative.   Cardiovascular: Negative.   Gastrointestinal: Negative.  Negative for blood in stool, diarrhea, nausea and vomiting.       Mild RUQ discomfort   Genitourinary: Negative.   Musculoskeletal: Negative.   Skin: Negative.   Neurological: Negative for seizures.  Endo/Heme/Allergies: Negative.   Psychiatric/Behavioral: Positive for depression.  All other systems reviewed and are negative.   Blood pressure 125/86, pulse 74, temperature 97.9 F (36.6 C), temperature source Oral, resp. rate 18, height 5\' 11"  (1.803 m), weight 109.8 kg (242 lb), SpO2 99 %.Body mass index is 33.75 kg/m.  General Appearance: Well Groomed  Eye Contact:  Good  Speech:  Normal Rate  Volume:  Normal  Mood:  presents depressed, anxious,but states mood is "OK" today  Affect:  Appropriate and vaguely anxious   Thought Process:  Linear and Descriptions of Associations: Intact  Orientation:  Full (Time, Place, and Person)  Thought Content:  No hallucinations,  No  delusions, not internally preoccupied   Suicidal Thoughts:  denies current suicidal or self injurious ideations  Homicidal Thoughts:  denies homicidal or violent ideations   Memory:  recent and remote grossly intact   Judgement:  Fair  Insight:  Present  Psychomotor Activity:  Normal  Concentration:  Concentration: Good and Attention Span: Good  Recall:  Good  Fund of Knowledge:  Good  Language:  Good  Akathisia:  Negative  Handed:  Right  AIMS (if indicated):     Assets:  Desire for Improvement Resilience  ADL's:  Intact  Cognition:  WNL  Sleep:  Number of Hours: 6.25    Treatment Plan Summary: Daily contact with patient to assess and evaluate symptoms and progress in treatment, Medication management, Plan inpatient treatment  and medications as below   Observation Level/Precautions:  15 minute checks  Laboratory: as needed   Psychotherapy: milieu, group therapy    Medications:  ( Patient states he has taken Wellbutrin for a period of months, and it has been partially helpful and well tolerated. Denies history of seizures, denies history of head trauma-  Increase Wellbutrin to 450 mgrs QDAY    Consultations: as needed  Discharge Concerns: -    Estimated LOS: 4-5 days  Other:     Physician Treatment Plan for Primary Diagnosis:  MDD Long Term Goal(s): Improvement in symptoms so as ready for discharge  Short Term Goals: Ability to verbalize feelings will improve, Ability to disclose and discuss suicidal ideas, Ability to demonstrate self-control will improve and Ability to identify and develop effective coping behaviors will improve  Physician Treatment Plan for Secondary Diagnosis: MDD  Long Term Goal(s): Improvement in symptoms so as ready for discharge  Short Term Goals: Ability to verbalize feelings will improve, Ability to disclose and discuss suicidal ideas, Ability to demonstrate self-control will improve, Ability to identify and develop effective coping behaviors will  improve and Ability to maintain clinical measurements within normal limits will improve  I certify that inpatient services furnished can reasonably be expected to improve the patient's condition.    Nehemiah Massed, MD 2/25/20181:00 PM

## 2016-06-14 NOTE — Progress Notes (Signed)
D: Patient remains anxious regarding seeing the doctor today.  He remains fixated on discharge.  He has verbalized that "I don't need to be here."  He is concerned over a meeting that he has at work tomorrow.  He continues to complain of some mild abdominal pain.  He rates his depression and hopelessness as a 2; anxiety as a 3.  He is visible in the milieu and is interacting well with his peers.  His affect is somewhat brighter from yesterday and he appears less anxious. A: Continue to monitor medication management and MD orders.  Safety checks completed every 15 minutes per protocol.  Offer support and encouragement as needed. R: Patient is receptive to staff; his behavior is appropriate.

## 2016-06-14 NOTE — [facility] Group Notes (Signed)
[facility] Group Notes:  (Clinical Social Work)   06/14/2016    10:00-11:00AM  Summary of Progress/Problems:   The main focus of today's process group was to   1)  discuss the importance of adding supports  2)  define health supports versus unhealthy supports  3)  identify the patient's current unhealthy supports and plan how to handle them  4)  Identify the patient's current healthy supports and plan what to add.  An emphasis was placed on using counselor, doctor, therapy groups, 12-step groups, and problem-specific support groups to expand supports.    The patient expressed full comprehension of the concepts presented, and agreed that there is a need to add more supports.  The patient stated his sister and mother, both of whom live in [place], are very supportive, as is a friend at work.  His father and stepmother as well as his attorney and he himself are unhealthy.  Type of Therapy:  Process Group with Motivational Interviewing  Participation Level:  Active  Participation Quality:  Attentive and Sharing  Affect:  Appropriate  Cognitive:  Appropriate  Insight:  Developing/Improving  Engagement in Therapy:  Engaged  Modes of Intervention:   Education, Support and Processing  Ambrose MantleMareida Grossman-Orr, LCSW 06/14/2016    2:49 PM       4

## 2016-06-15 LAB — HEPATITIS C VRS RNA DETECT BY PCR-QUAL: Hepatitis C Vrs RNA by PCR-Qual: NEGATIVE

## 2016-06-15 LAB — HEMOGLOBIN A1C
HEMOGLOBIN A1C: 5.5 % (ref 4.8–5.6)
MEAN PLASMA GLUCOSE: 111 mg/dL

## 2016-06-15 LAB — HEPATITIS B SURFACE ANTIBODY,QUALITATIVE: HEP B S AB: REACTIVE

## 2016-06-15 LAB — HEPATITIS B SURFACE ANTIGEN: HEP B S AG: NEGATIVE

## 2016-06-15 MED ORDER — IBUPROFEN 600 MG PO TABS: 600.0000 mg | ORAL_TABLET | Freq: Four times a day (QID) | ORAL | Status: DC | PRN

## 2016-06-15 NOTE — Progress Notes (Signed)
Recreation Therapy Notes  Date: 06/15/16 Time: 0930 Location: 300 [address] Dayroom  Group Topic: Stress Management  Goal Area(s) Addresses:  Patient will verbalize importance of using healthy stress management.  Patient will identify positive emotions associated with healthy stress management.   Behavioral Response: Engaged  Intervention: Stress Management  Activity :  [facility].  LRT introduced the stress management technique of guided imagery.  LRT read a script to allow patients to engage in the activity.  Patients were to follow along as LRT read script to participate in activity.  Education:  Stress Management, Discharge Planning.   Education Outcome: Acknowledges edcuation/In group clarification offered/Needs additional education  Clinical Observations/Feedback: Pt attended group.    Caroll RancherMarjette Rutger Salton, LRT/CTRS         Lillia AbedLindsay, Lyzette Reinhardt A 06/15/2016 1:16 PM

## 2016-06-15 NOTE — Progress Notes (Signed)
D:  Patient's self inventory sheet, patient sleeps good, sleep medication is helpful.  Good appetite, normal energy level, good concentration.  Rated depression, hopeless and anxiety #2.  Denied withdrawals.  Denied SI.  Physical problems, pain, aches, stomach.  Denied pain.   Worst pain #4 in past 24 hours, no pain medication.  Goal is to learn new skills.  Does have discharge plans. A:  Medications administered per MD orders.  Emotional support and encouragement given patient. R:  Denied SI and HI, contracts for safety.  Denied A/V hallucinations.  Safety maintained with 15 minute checks.

## 2016-06-15 NOTE — Progress Notes (Signed)
[facility] MD Progress Note  06/15/2016 2:12 PM Edwards Mckelvie  MRN:  [id number] Subjective:  Patient states that he is feeling better.  Frustrated that he came voluntarily but thought that it was not a "locked unit." Objective:  Patient states that he is improved.  States that he is recovering from pneumonia that helped him feel worse.  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  He is recently underdone a very contested divorce with loss of assets and recent problems with his daughters.  Moved to [place] a few months ago.  He states that he came through [facility] but that they misinterpreted him.  He did not want  Commit suicide, he admitted to cutting himself a few months ago but calls it exploratory.    Principal Problem: MDD (major depressive disorder), recurrent severe, without psychosis (HCC) Diagnosis:   Patient Active Problem List   Diagnosis Date Noted  . MDD (major depressive disorder), recurrent severe, without psychosis (HCC) [F33.2] 06/13/2016    Priority: High   Total Time spent with patient: 30 minutes  Past Psychiatric History: see HPI  Past Medical History:  Past Medical History:  Diagnosis Date  . Depression   . Flu due to oth ident flu virus w unsp type of pneumonia    within last 5 weeks/medically cleared  . Hepatitis A    Recent diagnosis (within 5 weeks)  . Medical history non-contributory   . Pneumonia    within last 5 weeks/finished antibiotics  . Sleep apnea    recent diagnosis   History reviewed. No pertinent surgical history. Family History: History reviewed. No pertinent family history. Family Psychiatric  History: see HPI Social History:  History  Alcohol Use  . 1.2 - 1.8 oz/week  . 2 - 3 Glasses of wine per week    Comment: 1/2 bottle weekly     History  Drug Use No    Social History   Social History  . Marital status: Divorced    Spouse name: N/A  . Number of children: N/A  . Years of education: N/A   Social History Main Topics  .  Smoking status: Former Games developer  . Smokeless tobacco: Never Used  . Alcohol use 1.2 - 1.8 oz/week    2 - 3 Glasses of wine per week     Comment: 1/2 bottle weekly  . Drug use: No  . Sexual activity: No   Other Topics Concern  . None   Social History Narrative  . None   Additional Social History:                         Sleep: Good  Appetite:  Good  Current Medications: Current Facility-Administered Medications  Medication Dose Route Frequency Provider Last Rate Last Dose  . alum & mag hydroxide-simeth (MAALOX/MYLANTA) 200-200-20 MG/5ML suspension 30 mL  30 mL Oral Q4H PRN Beau Fanny, FNP      . buPROPion (WELLBUTRIN XL) 24 hr tablet 450 mg  450 mg Oral Daily Craige Cotta, MD   450 mg at 06/15/16 0732  . magnesium hydroxide (MILK OF MAGNESIA) suspension 30 mL  30 mL Oral Daily PRN Beau Fanny, FNP      . predniSONE (DELTASONE) tablet 10 mg  10 mg Oral BID WC Beau Fanny, FNP   10 mg at 06/15/16 0737  . [START ON 06/16/2016] predniSONE (DELTASONE) tablet 10 mg  10 mg Oral Q breakfast Beau Fanny,  FNP      . traZODone (DESYREL) tablet 50 mg  50 mg Oral QHS,MR X 1 Jackelyn Poling, NP   50 mg at 06/14/16 2258    Lab Results:  Results for orders placed or performed during the hospital encounter of 06/13/16 (from the past 48 hour(s))  Hemoglobin A1c     Status: None   Collection Time: 06/14/16  6:00 AM  Result Value Ref Range   Hgb A1c MFr Bld 5.5 4.8 - 5.6 %    Comment: (NOTE)         Pre-diabetes: 5.7 - 6.4         Diabetes: >6.4         Glycemic control for adults with diabetes: <7.0    Mean Plasma Glucose 111 mg/dL    Comment: (NOTE) Performed At: [facility] [address] [place], [place] [id number] Mila Homer MD [id number] Performed at [facility], [address] [place], [place] [address]   Lipid panel     Status: Abnormal   Collection Time: 06/14/16  6:00 AM  Result Value Ref Range   Cholesterol 302  (H) 0 - 200 mg/dL   Triglycerides 629 (H) <150 mg/dL   HDL 52 >52 mg/dL   Total CHOL/HDL Ratio 5.8 RATIO   VLDL 48 (H) 0 - 40 mg/dL   LDL Cholesterol 841 (H) 0 - 99 mg/dL    Comment:        Total Cholesterol/HDL:CHD Risk Coronary Heart Disease Risk Table                     Men   Women  1/2 Average Risk   3.4   3.3  Average Risk       5.0   4.4  2 X Average Risk   9.6   7.1  3 X Average Risk  23.4   11.0        Use the calculated Patient Ratio above and the CHD Risk Table to determine the patient's CHD Risk.        ATP III CLASSIFICATION (LDL):  <100     mg/dL   Optimal  324-401  mg/dL   Near or Above                    Optimal  130-159  mg/dL   Borderline  027-253  mg/dL   High  >664     mg/dL   Very High Performed at [facility], [address] [place], [place] [address]   TSH     Status: None   Collection Time: 06/14/16  6:00 AM  Result Value Ref Range   TSH 1.055 0.350 - 4.500 uIU/mL    Comment: Performed by a 3rd Generation assay with a functional sensitivity of <=0.01 uIU/mL. Performed at [facility], [address], [place], [place] [address]     Blood Alcohol level:  Lab Results  Component Value Date   ETH <5 06/12/2016    Metabolic Disorder Labs: Lab Results  Component Value Date   HGBA1C 5.5 06/14/2016   MPG 111 06/14/2016   No results found for: PROLACTIN Lab Results  Component Value Date   CHOL 302 (H) 06/14/2016   TRIG 238 (H) 06/14/2016   HDL 52 06/14/2016   CHOLHDL 5.8 06/14/2016   VLDL 48 (H) 06/14/2016   LDLCALC 202 (H) 06/14/2016    Physical Findings: AIMS: Facial and Oral Movements Muscles  of Facial Expression: None, normal Lips and Perioral Area: None, normal Jaw: None, normal Tongue: None, normal,Extremity Movements Upper (arms, wrists, hands, fingers): None, normal Lower (legs, knees, ankles, toes): None, normal, Trunk Movements Neck, shoulders, hips: None, normal, Overall Severity Severity of  abnormal movements (highest score from questions above): None, normal Incapacitation due to abnormal movements: None, normal Patient's awareness of abnormal movements (rate only patient's report): No Awareness, Dental Status Current problems with teeth and/or dentures?: No Does patient usually wear dentures?: No  CIWA:  CIWA-Ar Total: 1 COWS:  COWS Total Score: 1  Musculoskeletal: Strength & Muscle Tone: within normal limits Gait & Station: normal Patient leans: N/A  Psychiatric Specialty Exam: Physical Exam  ROS  Blood pressure 110/80, pulse 72, temperature 98.1 F (36.7 C), temperature source Oral, resp. rate 16, height 5\' 11"  (1.803 m), weight 109.8 kg (242 lb), SpO2 99 %.Body mass index is 33.75 kg/m.  General Appearance: Casual  Eye Contact:  Good  Speech:  Clear and Coherent  Volume:  Normal  Mood:  Anxious  Affect:  Appropriate and Congruent  Thought Process:  Linear  Orientation:  Full (Time, Place, and Person)  Thought Content:  Logical  Suicidal Thoughts:  No  Homicidal Thoughts:  No  Memory:  Immediate;   Fair Recent;   Fair Remote;   Fair  Judgement:  Fair  Insight:  Fair  Psychomotor Activity:  Normal  Concentration:  Concentration: Fair and Attention Span: Fair  Recall:  [employer]Fair  Fund of Knowledge:  Fair  Language:  Fair  Akathisia:  Yes  Handed:  Right  AIMS (if indicated):     Assets:  Communication Skills Desire for Improvement Resilience Talents/Skills Vocational/Educational  ADL's:  Intact  Cognition:  WNL  Sleep:  Number of Hours: 6   Treatment Plan Summary: Review of chart, vital signs, medications, and notes.  1-Individual and group therapy  2-Medication management for depression and anxiety: Medications reviewed with the patient.  Wellbutrin XL  450 mg daily depression 3-Coping skills for depression, anxiety  4-Continue crisis stabilization and management  5-Address health issues--monitoring vital signs, stable  6-Treatment plan in  progress to prevent relapse of depression and anxiety  Lindwood QuaSheila May Tearra Ouk, NP [facility] 06/15/2016, 2:12 PM

## 2016-06-15 NOTE — Plan of Care (Signed)
Problem: Safety: Goal: Periods of time without injury will increase Outcome: Progressing Pt. remains a low fall risk, denies SI/HI/AVH at this time, Q 15 checks in place.    

## 2016-06-15 NOTE — [facility] Group Notes (Signed)
[facility] LCSW Group Therapy  06/15/2016 1:15pm  Type of Therapy:  Group Therapy vercoming Obstacles  Participation Level:  Reserved  Participation Quality:  Attentive  Affect:  Flat  Cognitive:  Appropriate and Oriented  Insight:  Developing/Improving and Improving  Engagement in Therapy:  Improving  Modes of Intervention:  Discussion, Exploration, Problem-solving and Support  Description of Group:   In this group patients will be encouraged to explore what they see as obstacles to their own wellness and recovery. They will be guided to discuss their thoughts, feelings, and behaviors related to these obstacles. The group will process together ways to cope with barriers, with attention given to specific choices patients can make. Each patient will be challenged to identify changes they are motivated to make in order to overcome their obstacles. This group will be process-oriented, with patients participating in exploration of their own experiences as well as giving and receiving support and challenge from other group members.  Summary of Patient Progress: Pt  Participated minimally in group discussion but appeared to be attentive throughout.   Therapeutic Modalities:   Cognitive Behavioral Therapy Solution Focused Therapy Motivational Interviewing Relapse Prevention Therapy   Vernie ShanksLauren Felicha Frayne, LCSW 06/15/2016 3:29 PM

## 2016-06-15 NOTE — [facility] Suicide Risk Assessment (Signed)
[facility] INPATIENT:  Family/Significant Other Suicide Prevention Education  Suicide Prevention Education:  Contact Attempts: Armed forces logistics/support/administrative officerandora Fortson, Pt's sister, has been identified by the patient as the family member/significant other with whom the patient will be residing, and identified as the person(s) who will aid the patient in the event of a mental health crisis.  With written consent from the patient, two attempts were made to provide suicide prevention education, prior to and/or following the patient's discharge.  We were unsuccessful in providing suicide prevention education.  A suicide education pamphlet was given to the patient to share with family/significant other.  Date and time of first attempt: 06/15/16 @ 12:15pm  This family member has an international phone number; despite using the outlined steps provided by telecommunications, the number was unsuccessful. SPE reviewed with patient and brochure provided. Patient encouraged to return to hospital if having suicidal thoughts, patient verbalized his/her understanding and has no further questions at this time.   Verdene LennertLauren C Linnae Rasool 06/15/2016, 12:27 PM

## 2016-06-15 NOTE — Plan of Care (Signed)
Problem: Activity: Goal: Interest or engagement in leisure activities will improve Outcome: Progressing Nurse discussed anxiety/depression/coping skills with patient.    

## 2016-06-15 NOTE — Progress Notes (Signed)
  DATA ACTION RESPONSE  Objective- Pt. is up and visible in the milieu, seen interacting with peers. Pt. presents with an anxious/depressed affect and mood. With interaction, Pt. appears to be minimizing s/s and is requesting to be d/c soon for work.  Subjective- Denies having any SI/HI/AVH/Pain at this time. Pt. states " I haven't really learned much but I enjoy meeting the people here". Pt. states stressors being seperated from wife of 25 years, rocky relationship with daugher and achieving balance between work travels and self care.  Pt. continues to be cooperative and remain safe on the unit.  1:1 interaction in private to establish rapport. Encouragement, education, & support given from staff. Meds. ordered and administered.   Safety maintained with Q 15 checks. Continues to follow treatment plan and will monitor closely. No additonal questions/concerns noted.

## 2016-06-15 NOTE — Tx Team (Signed)
Interdisciplinary Treatment and Diagnostic Plan Update  06/15/2016 Time of Session: 9:30am Arthur Cruz MRN: [id number]  Principal Diagnosis: Major Depression, Recurrent, no Psychotic Features   Secondary Diagnoses: Active Problems:   MDD (major depressive disorder), recurrent severe, without psychosis (HCC)   Current Medications:  Current Facility-Administered Medications  Medication Dose Route Frequency Provider Last Rate Last Dose  . alum & mag hydroxide-simeth (MAALOX/MYLANTA) 200-200-20 MG/5ML suspension 30 mL  30 mL Oral Q4H PRN John C Withrow, FNP      . buPROPion (WELLBUTRIN XL) 24 hr tablet 450 mg  450 mg Oral Daily Fernando A Cobos, MD   450 mg at 06/15/16 0732  . magnesium hydroxide (MILK OF MAGNESIA) suspension 30 mL  30 mL Oral Daily PRN John C Withrow, FNP      . predniSONE (DELTASONE) tablet 10 mg  10 mg Oral BID WC John C Withrow, FNP   10 mg at 06/15/16 0737  . [START ON 06/16/2016] predniSONE (DELTASONE) tablet 10 mg  10 mg Oral Q breakfast John C Withrow, FNP      . traZODone (DESYREL) tablet 50 mg  50 mg Oral QHS,MR X 1 Jason A Berry, NP   50 mg at 06/14/16 2258    PTA Medications: Prescriptions Prior to Admission  Medication Sig Dispense Refill Last Dose  . buPROPion (WELLBUTRIN XL) 300 MG 24 hr tablet Take 1 tablet by mouth daily.  1   . predniSONE (DELTASONE) 10 MG tablet Take 10 mg by mouth daily.  0     Treatment Modalities: Medication Management, Group therapy, Case management,  1 to 1 session with clinician, Psychoeducation, Recreational therapy.  Patient Stressors: Financial difficulties Health problems Marital or family conflict Traumatic event  Patient Strengths: Average or above average intelligence Capable of independent living Financial means Supportive family/friends Work skills  Physician Treatment Plan for Primary Diagnosis: Major Depression, Recurrent, no Psychotic Features  Long Term Goal(s): Improvement in symptoms so as ready  for discharge  Short Term Goals: Ability to verbalize feelings will improve Ability to disclose and discuss suicidal ideas Ability to demonstrate self-control will improve Ability to identify and develop effective coping behaviors will improve Ability to verbalize feelings will improve Ability to disclose and discuss suicidal ideas Ability to demonstrate self-control will improve Ability to identify and develop effective coping behaviors will improve Ability to maintain clinical measurements within normal limits will improve  Medication Management: Evaluate patient's response, side effects, and tolerance of medication regimen.  Therapeutic Interventions: 1 to 1 sessions, Unit Group sessions and Medication administration.  Evaluation of Outcomes: Not Met  Physician Treatment Plan for Secondary Diagnosis: Active Problems:   MDD (major depressive disorder), recurrent severe, without psychosis (HCC)   Long Term Goal(s): Improvement in symptoms so as ready for discharge  Short Term Goals: Ability to verbalize feelings will improve Ability to disclose and discuss suicidal ideas Ability to demonstrate self-control will improve Ability to identify and develop effective coping behaviors will improve Ability to verbalize feelings will improve Ability to disclose and discuss suicidal ideas Ability to demonstrate self-control will improve Ability to identify and develop effective coping behaviors will improve Ability to maintain clinical measurements within normal limits will improve  Medication Management: Evaluate patient's response, side effects, and tolerance of medication regimen.  Therapeutic Interventions: 1 to 1 sessions, Unit Group sessions and Medication administration.  Evaluation of Outcomes: Not Met   RN Treatment Plan for Primary Diagnosis: Major Depression, Recurrent, no Psychotic Features  Long Term Goal(s): Knowledge of disease   and therapeutic regimen to maintain health  will improve  Short Term Goals: Ability to verbalize feelings will improve, Ability to disclose and discuss suicidal ideas and Ability to identify and develop effective coping behaviors will improve  Medication Management: RN will administer medications as ordered by provider, will assess and evaluate patient's response and provide education to patient for prescribed medication. RN will report any adverse and/or side effects to prescribing provider.  Therapeutic Interventions: 1 on 1 counseling sessions, Psychoeducation, Medication administration, Evaluate responses to treatment, Monitor vital signs and CBGs as ordered, Perform/monitor CIWA, COWS, AIMS and Fall Risk screenings as ordered, Perform wound care treatments as ordered.  Evaluation of Outcomes: Not Met   LCSW Treatment Plan for Primary Diagnosis: Major Depression, Recurrent, no Psychotic Features  Long Term Goal(s): Safe transition to appropriate next level of care at discharge, Engage patient in therapeutic group addressing interpersonal concerns.  Short Term Goals: Engage patient in aftercare planning with referrals and resources, Identify triggers associated with mental health/substance abuse issues and Increase skills for wellness and recovery  Therapeutic Interventions: Assess for all discharge needs, 1 to 1 time with Social worker, Explore available resources and support systems, Assess for adequacy in community support network, Educate family and significant other(s) on suicide prevention, Complete Psychosocial Assessment, Interpersonal group therapy.  Evaluation of Outcomes: Not Met   Progress in Treatment: Attending groups: Yes  Participating in groups: Yes  Taking medication as prescribed: Yes, MD continues to assess for medication changes as needed Toleration medication: Yes, no side effects reported at this time Family/Significant other contact made: No, CSW attempting to make contact with sister Patient understands  diagnosis: Continuing to assess Discussing patient identified problems/goals with staff: Yes Medical problems stabilized or resolved: Yes Denies suicidal/homicidal ideation: Yes Issues/concerns per patient self-inventory: None Other: N/A  New problem(s) identified: None identified at this time.   New Short Term/Long Term Goal(s): None identified at this time.   Discharge Plan or Barriers: Pt will return home and follow-up with outpatient services. CSW to facilitate appropriate referral.  Reason for Continuation of Hospitalization: Anxiety Depression Medication stabilization Suicidal ideation  Estimated Length of Stay: 2-4 days  Attendees: Patient: 06/15/2016  8:55 AM  Physician: Dr. Eappen 06/15/2016  8:55 AM  Nursing: Patrice W., Beverly K., Christa D., RN 06/15/2016  8:55 AM  RN Care Manager: Jennifer Clark, RN 06/15/2016  8:55 AM  Social Worker: Lauren Newton, LCSW; Lynn Bryant, LCSWA 06/15/2016  8:55 AM  Recreational Therapist:  06/15/2016  8:55 AM  Other: Agnes Nwoko, NP; May Augustin, NP 06/15/2016  8:55 AM  Other:  06/15/2016  8:55 AM  Other: 06/15/2016  8:55 AM    Scribe for Treatment Team: Lauren C Newton, LCSW 06/15/2016 8:55 AM  

## 2016-06-16 MED ORDER — BUPROPION HCL ER (XL) 450 MG PO TB24: 450.0000 mg | ORAL_TABLET | Freq: Every day | ORAL | 0 refills | Status: AC

## 2016-06-16 MED ORDER — TRAZODONE HCL 50 MG PO TABS: 50.0000 mg | ORAL_TABLET | Freq: Every evening | ORAL | 0 refills | Status: AC | PRN

## 2016-06-16 NOTE — Progress Notes (Signed)
Discharge Note:  Patient discharged with [facility].  Patient denied SI and HI.  Denied A/V hallucinations. Suicide prevention information given and discussed with patient who stated he understood and had no questions.  Patient stated he received all his belongings, clothing, toiletries, misc items, prescriptions, phone, charter, wallet, checkbook, passport, DL's, cards, keys, jewelry, etc.  Patient stated he [facility] all [facility].  All required discharge information given to patient at discharge.

## 2016-06-16 NOTE — Progress Notes (Signed)
  [facility]  Will you be returning to the same living situation after discharge:  Yes,  pt is returning home. At discharge, do you have transportation home?: Yes,  pt has access to transportation. Do you have the ability to pay for your medications: Yes,  prescriptions provided.  Release of information consent forms completed and in the chart;  Patient's signature needed at discharge.  Patient to Follow up at: Follow-up Information    [facility] Follow up on 06/18/2016.   Specialty:  Rheumatology Why:  at 10:00am with French Anaracy, NP for medication management.  Contact information: [address] [place] [address] [phone]        [facility] Follow up.   Why:  Please call within 1-3 days of discharge to schedule an appointment for therapy. Contact information: [address] [place] [address] [phone]           Next level of care provider has [facility] Link:no  Safety Planning and Suicide Prevention discussed: Yes,  with pt.  Have you used any form of tobacco in the last 30 days? (Cigarettes, Smokeless Tobacco, Cigars, and/or Pipes): No  Has patient been referred to the Quitline?: N/A patient is not a smoker  Patient has been referred for addiction treatment: N/A  Jonathon JordanLynn B Kilie Rund 06/16/2016, 1:10 PM

## 2016-06-16 NOTE — Progress Notes (Signed)
D:  Patient's self inventory sheet, patient sleeps good, sleep medication is helpful.  Good appetite, normal energy level, good concentration.  Denied depression, hopeless and anxiety.  Denied withdrawals.  Denied SI.  Denied physical problems.  Physical pain, worst pain #3, liver, stomach.  Does not take pain medication.  Goal is to discharge and have follow up appointments.  Plans to talk to MD/SW.  A:  Medications administered per MD orders.  Emotional support and encouragement given patient. R:  Patient denied SI and HI, contracts for safety.  Denied A/V hallucinations.  Safety maintained with 15 minute checks.

## 2016-06-16 NOTE — [facility] Group Notes (Signed)

## 2016-06-16 NOTE — [facility] Suicide Risk Assessment (Addendum)
[facility] Discharge Suicide Risk Assessment   Principal Problem: MDD (major depressive disorder), recurrent severe, without psychosis (HCC) Discharge Diagnoses:  Patient Active Problem List   Diagnosis Date Noted  . MDD (major depressive disorder), recurrent severe, without psychosis (HCC) [F33.2] 06/13/2016    Total Time spent with patient: 30 minutes  Musculoskeletal: Strength & Muscle Tone: within normal limits Gait & Station: normal Patient leans: N/A  Psychiatric Specialty Exam: ROS  No headache, no chest pain, no shortness of breath, no nausea, no vomiting, no fever, no chills, no coluria or acholia reported , no RUQ pain today  Blood pressure 134/76, pulse 72, temperature 97.2 F (36.2 C), temperature source Oral, resp. rate 20, height 5\' 11"  (1.803 m), weight 109.8 kg (242 lb), SpO2 99 %.Body mass index is 33.75 kg/m.  General Appearance: Well Groomed  Eye Contact::  Good  Speech:  Normal Rate409  Volume:  Normal  Mood:  improved mood , at this time reports " I am feeling much better"  Affect:  appropriate, more reactive, less constricted, smiles at times appropriately   Thought Process:  Linear and Descriptions of Associations: Intact  Orientation:  Full (Time, Place, and Person)  Thought Content:  denies hallucinations, no delusions, not internally preoccupied   Suicidal Thoughts:  No at this time denies any suicidal or self injurious ideations, and identifies love for his children as a protective factor against suicide or hurting self   Homicidal Thoughts:  No denies any violent or homicidal ideations, specifically also denies any homicidal or violent ideations towards ex wife   Memory:  Recent and remote grossly intact   Judgement:  Other:  improving   Insight:  improving   Psychomotor Activity:  Normal  Concentration:  Good  Recall:  Good  Fund of Knowledge:Good  Language: Good  Akathisia:  Negative  Handed:  Right  AIMS (if indicated):     Assets:  Desire for  Improvement Resilience Vocational/Educational  Sleep:  Number of Hours: 6.25  Cognition: WNL  ADL's:  Intact   Mental Status Per Nursing Assessment::   On Admission:  Belief that plan would result in death  Demographic Factors:  46 year old male, lives alone , employed   Loss Factors: Recent divorce, financial difficulties, recently had become ill with flu and with Hep A  Historical Factors: History of depression, no history of suicide attempts, no prior psychiatric admissions   Risk Reduction Factors:   Responsible for children under 71 years of age, Sense of responsibility to family, Employed and Positive coping skills or problem solving skills  Continued Clinical Symptoms:  At this time patient is alert, attentive, well groomed, well related, calm, pleasant on approach, mood reported as improved, affect appropriate and reactive,brighter than on admission, no thought disorder , linear, no suicidal or self injurious ideations, no homicidal or violent ideations, no hallucinations, no delusions, future oriented. States , for example that in several weeks he is hoping to relocate to [place] , in order to live closer to his children. Denies medication side effects. Has tolerated Wellbutrin dose titration well, no side effects. We have discussed side effects, including potential increased risk of seizure. Behavior on unit in good control, interacting appropriately with peers  Cognitive Features That Contribute To Risk:  No gross cognitive deficits noted upon discharge. Is alert , attentive, and oriented x 3   Suicide Risk:  Mild:  Suicidal ideation of limited frequency, intensity, duration, and specificity.  There are no identifiable plans, no associated intent,  mild dysphoria and related symptoms, good self-control (both objective and subjective assessment), few other risk factors, and identifiable protective factors, including available and accessible social support.  Follow-up  Information    [facility] Follow up on 06/18/2016.   Specialty:  Rheumatology Why:  at 10:00am with French Anaracy, NP for medication management.  Contact information: [address] [place] [address] [phone]        [facility] Follow up.   Why:  Please call within 1-3 days of discharge to schedule an appointment for therapy. Contact information: [address] [place] [place] [address] [phone]           Plan Of Care/Follow-up recommendations:  Activity:  as tolerated  Diet:  regular Tests:  NA Other:  see below   Patient is leaving in good spirits  Plans to return home, and plans to return to work in the near future  Follow up as above  He also plans to follow up with his established PCP for ongoing management of his steroid course, lipid profile, and other medical issues as needed   Nehemiah MassedOBOS, Ambrea Hegler, MD 06/16/2016, 12:13 PM

## 2016-06-16 NOTE — Discharge Summary (Signed)
Physician Discharge Summary Note  Patient:  Arthur Cruz is an 46 y.o., male MRN:  [id number] DOB:  1970/08/19 Patient phone:  [phone] (home)  Patient address:   [address] [place] [address],  Total Time spent with patient: 30 minutes  Date of Admission:  06/13/2016 Date of Discharge: 06/16/2016  Reason for Admission:  Suicidal ideations  Principal Problem: MDD (major depressive disorder), recurrent severe, without psychosis [facility]) Discharge Diagnoses: Patient Active Problem List   Diagnosis Date Noted  . MDD (major depressive disorder), recurrent severe, without psychosis (HCC) [F33.2] 06/13/2016    Priority: High    Past Psychiatric History:  See HPI  Past Medical History:  Past Medical History:  Diagnosis Date  . Depression   . Flu due to oth ident flu virus w unsp type of pneumonia    within last 5 weeks/medically cleared  . Hepatitis A    Recent diagnosis (within 5 weeks)  . Medical history non-contributory   . Pneumonia    within last 5 weeks/finished antibiotics  . Sleep apnea    recent diagnosis   History reviewed. No pertinent surgical history. Family History: History reviewed. No pertinent family history. Family Psychiatric  History: see HPI Social History:  History  Alcohol Use  . 1.2 - 1.8 oz/week  . 2 - 3 Glasses of wine per week    Comment: 1/2 bottle weekly     History  Drug Use No    Social History   Social History  . Marital status: Divorced    Spouse name: N/A  . Number of children: N/A  . Years of education: N/A   Social History Main Topics  . Smoking status: Former Games developer  . Smokeless tobacco: Never Used  . Alcohol use 1.2 - 1.8 oz/week    2 - 3 Glasses of wine per week     Comment: 1/2 bottle weekly  . Drug use: No  . Sexual activity: No   Other Topics Concern  . None   Social History Narrative  . None    Hospital Course:  Arthur Cruz, 46 yo male patient , developed  suicidal ideations.  Patient has hx of depression and was being managed outpatient provider, on Wellbutrin (for about 2 years).  He is undergoing enormous stress from contested divorce, wherein he "lost everything."  Couples with contracting an illness, Hep A, from business travel to [place] and also pneumonia, he developed feelings of despair.    Arthur Cruz was admitted for MDD (major depressive disorder), recurrent severe, without psychosis (HCC) and crisis management.  Patient was treated with medications with their indications listed below in detail under Medication List.  Medical problems were identified and treated as needed.  Home medications were restarted as appropriate.  Improvement was monitored by observation and Arthur Asa daily report of symptom reduction.  Emotional and mental status was monitored by daily self inventory reports completed by Arthur Asa and clinical staff.  Patient reported continued improvement, denied any new concerns.  Patient had been compliant on medications and denied side effects.  Support and encouragement was provided.         Arthur Cruz was evaluated by the treatment team for stability and plans for continued recovery upon discharge.  Patient was offered further treatment options upon discharge including Residential, Intensive Outpatient and Outpatient treatment. Patient will follow up with agency listed below for medication management and counseling.  Encouraged patient to maintain satisfactory support network and home environment.  Advised  to adhere to medication compliance and outpatient treatment follow up.  Prescriptions provided.       Arthur Cruz motivation was an integral factor for scheduling further treatment.  Employment, transportation, bed availability, health status, family support, and any pending legal issues were also considered during patient's hospital stay.  Upon completion of this admission the patient was  both mentally and medically stable for discharge denying suicidal/homicidal ideation, auditory/visual/tactile hallucinations, delusional thoughts and paranoia.      Note that patient was given education materials on Hepatitis A, treatment and management.  Advised to follow up with PCP and Gastroenterology.  Physical Findings: AIMS: Facial and Oral Movements Muscles of Facial Expression: None, normal Lips and Perioral Area: None, normal Jaw: None, normal Tongue: None, normal,Extremity Movements Upper (arms, wrists, hands, fingers): None, normal Lower (legs, knees, ankles, toes): None, normal, Trunk Movements Neck, shoulders, hips: None, normal, Overall Severity Severity of abnormal movements (highest score from questions above): None, normal Incapacitation due to abnormal movements: None, normal Patient's awareness of abnormal movements (rate only patient's report): No Awareness, Dental Status Current problems with teeth and/or dentures?: No Does patient usually wear dentures?: No  CIWA:  CIWA-Ar Total: 1 COWS:  COWS Total Score: 1  Musculoskeletal: Strength & Muscle Tone: within normal limits Gait & Station: normal Patient leans: N/A  Psychiatric Specialty Exam:  See MD SRA Physical Exam  Nursing note and vitals reviewed.   ROS  Blood pressure 134/76, pulse 72, temperature 97.2 F (36.2 C), temperature source Oral, resp. rate 20, height 5\' 11"  (1.803 m), weight 109.8 kg (242 lb), SpO2 99 %.Body mass index is 33.75 kg/m.    Have you used any form of tobacco in the last 30 days? (Cigarettes, Smokeless Tobacco, Cigars, and/or Pipes): No  Has this patient used any form of tobacco in the last 30 days? (Cigarettes, Smokeless Tobacco, Cigars, and/or Pipes) Yes, N/A  Blood Alcohol level:  Lab Results  Component Value Date   ETH <5 06/12/2016    Metabolic Disorder Labs:  Lab Results  Component Value Date   HGBA1C 5.5 06/14/2016   MPG 111 06/14/2016   No results found for:  PROLACTIN Lab Results  Component Value Date   CHOL 302 (H) 06/14/2016   TRIG 238 (H) 06/14/2016   HDL 52 06/14/2016   CHOLHDL 5.8 06/14/2016   VLDL 48 (H) 06/14/2016   LDLCALC 202 (H) 06/14/2016    See Psychiatric Specialty Exam and Suicide Risk Assessment completed by Attending Physician prior to discharge.  Discharge destination:  Home  Is patient on multiple antipsychotic therapies at discharge:  No   Has Patient had three or more failed trials of antipsychotic monotherapy by history:  No  Recommended Plan for Multiple Antipsychotic Therapies: NA   Allergies as of 06/16/2016   No Known Allergies     Medication List    STOP taking these medications   predniSONE 10 MG tablet Commonly known as:  DELTASONE     TAKE these medications     Indication  BuPROPion HCl ER (XL) 450 MG Tb24 Take 450 mg by mouth daily. Start taking on:  06/17/2016 What changed:  medication strength  how much to take  Indication:  Major Depressive Disorder   traZODone 50 MG tablet Commonly known as:  DESYREL Take 1 tablet (50 mg total) by mouth at bedtime and may repeat dose one time if needed.  Indication:  Trouble Sleeping      Follow-up Information    [facility] Follow  up on 06/18/2016.   Specialty:  Rheumatology Why:  at 10:00am with French Ana, NP for medication management.  Contact information: [address] [place] [address] [phone]        [facility] Follow up.   Why:  Please call within 1-3 days of discharge to schedule an appointment for therapy. Contact information: [address] [place] [address] [phone]           Follow-up recommendations:  Activity:  as tol Diet:  as tol  Comments:  1.  Take all your medications as prescribed.   2.  Report any adverse side effects to outpatient provider. 3.  Patient instructed to not use alcohol or illegal drugs while on prescription  medicines. 4.  In the event of worsening symptoms, instructed patient to call 911, the crisis hotline or go to nearest emergency room for evaluation of symptoms.  Signed: Lindwood Qua, NP [facility] 06/16/2016, 1:38 PM

## 2016-07-06 ENCOUNTER — Ambulatory Visit: Payer: Self-pay | Admitting: Otolaryngology

## 2016-07-07 ENCOUNTER — Encounter (HOSPITAL_COMMUNITY)
Admission: RE | Admit: 2016-07-07 | Discharge: 2016-07-07 | Disposition: A | Discharge status: Home / Self Care | Payer: 59 | Source: Ambulatory Visit | Attending: Otolaryngology | Admitting: Otolaryngology

## 2016-07-07 ENCOUNTER — Encounter (HOSPITAL_COMMUNITY): Payer: Self-pay

## 2016-07-07 DIAGNOSIS — J343 Hypertrophy of nasal turbinates: Secondary | ICD-10-CM | POA: Diagnosis not present

## 2016-07-07 DIAGNOSIS — Z7951 Long term (current) use of inhaled steroids: Secondary | ICD-10-CM | POA: Diagnosis not present

## 2016-07-07 DIAGNOSIS — F329 Major depressive disorder, single episode, unspecified: Secondary | ICD-10-CM | POA: Diagnosis not present

## 2016-07-07 DIAGNOSIS — R0683 Snoring: Secondary | ICD-10-CM | POA: Diagnosis present

## 2016-07-07 DIAGNOSIS — Z6834 Body mass index (BMI) 34.0-34.9, adult: Secondary | ICD-10-CM | POA: Diagnosis not present

## 2016-07-07 DIAGNOSIS — G4730 Sleep apnea, unspecified: Secondary | ICD-10-CM | POA: Diagnosis not present

## 2016-07-07 LAB — BASIC METABOLIC PANEL
Anion gap: 12 (ref 5–15)
BUN: 13 mg/dL (ref 6–20)
CHLORIDE: 104 mmol/L (ref 101–111)
CO2: 22 mmol/L (ref 22–32)
CREATININE: 0.94 mg/dL (ref 0.61–1.24)
Calcium: 9.4 mg/dL (ref 8.9–10.3)
GFR calc Af Amer: >60 mL/min (ref >60)
GFR calc non Af Amer: >60 mL/min (ref >60)
GLUCOSE: 140 mg/dL — AB (ref 65–99)
Potassium: 4.1 mmol/L (ref 3.5–5.1)
Sodium: 138 mmol/L (ref 135–145)

## 2016-07-07 LAB — CBC
HEMATOCRIT: 47.6 % (ref 39.0–52.0)
Hemoglobin: 16.1 g/dL (ref 13.0–17.0)
MCH: 29.1 pg (ref 26.0–34.0)
MCHC: 33.8 g/dL (ref 30.0–36.0)
MCV: 86.1 fL (ref 78.0–100.0)
PLATELETS: 232 10*3/uL (ref 150–400)
RBC: 5.53 MIL/uL (ref 4.22–5.81)
RDW: 13.5 % (ref 11.5–15.5)
WBC: 6.6 10*3/uL (ref 4.0–10.5)

## 2016-07-07 NOTE — Pre-Procedure Instructions (Signed)
    Arthur Cruz  07/07/2016    Your procedure is scheduled on Wednesday, March 21.  [facility] Admitting at 7:00 AM                Your surgery or procedure is scheduled for  9:00 AM   Call this number if you have problems the morning of surgery: [phone]   Remember:  Do not eat food or drink liquids after midnight.   Take these medicines the morning of surgery with A SIP OF WATER: buPROPion.              STOP taking Aspirin, Aspirin Products (Goody Powder, Excedrin Migraine), Ibuprofen (Advil), Naproxen (Aleve), Viatiams and Herbal Products (ie Fish Oil)   Do not wear jewelry, make-up or nail polish.  Do not wear lotions, powders, or perfumes, or deodorant.  Do not shave 48 hours prior to surgery.  Men may shave face and neck.  Do not bring valuables to the hospital.  [facility] is not responsible for any belongings or valuables.  Contacts, dentures or bridgework may not be worn into surgery.  Leave your suitcase in the car.  After surgery it may be brought to your room.  For patients admitted to the hospital, discharge time will be determined by your treatment team.  Patients discharged the day of surgery will not be allowed to drive home.   Name and phone number of your driver:  -  Special instructions: Review  [facility] - Preparing For Surgery.  Please read over the following fact sheets that you were given: [facility]- Preparing For Surgery, Coughing and Deep Breathing, Surgical Site Infections, Pain Booklet

## 2016-07-08 ENCOUNTER — Encounter (HOSPITAL_COMMUNITY)
Admission: RE | Disposition: A | Discharge status: Home / Self Care | Payer: Self-pay | Source: Ambulatory Visit | Attending: Otolaryngology

## 2016-07-08 ENCOUNTER — Ambulatory Visit (HOSPITAL_COMMUNITY): Payer: 59 | Admitting: Anesthesiology

## 2016-07-08 ENCOUNTER — Observation Stay (HOSPITAL_COMMUNITY)
Admission: RE | Admit: 2016-07-08 | Discharge: 2016-07-08 | Disposition: A | Discharge status: Home / Self Care | Payer: 59 | Source: Ambulatory Visit | Attending: Otolaryngology | Admitting: Otolaryngology

## 2016-07-08 ENCOUNTER — Encounter (HOSPITAL_COMMUNITY): Payer: Self-pay | Admitting: General Practice

## 2016-07-08 DIAGNOSIS — G4733 Obstructive sleep apnea (adult) (pediatric): Secondary | ICD-10-CM | POA: Diagnosis present

## 2016-07-08 DIAGNOSIS — Z6834 Body mass index (BMI) 34.0-34.9, adult: Secondary | ICD-10-CM | POA: Insufficient documentation

## 2016-07-08 DIAGNOSIS — J343 Hypertrophy of nasal turbinates: Principal | ICD-10-CM | POA: Insufficient documentation

## 2016-07-08 DIAGNOSIS — G4730 Sleep apnea, unspecified: Secondary | ICD-10-CM | POA: Insufficient documentation

## 2016-07-08 DIAGNOSIS — Z7951 Long term (current) use of inhaled steroids: Secondary | ICD-10-CM | POA: Insufficient documentation

## 2016-07-08 DIAGNOSIS — F329 Major depressive disorder, single episode, unspecified: Secondary | ICD-10-CM | POA: Insufficient documentation

## 2016-07-08 HISTORY — PX: TURBINATE REDUCTION: SHX6157

## 2016-07-08 SURGERY — REDUCTION, NASAL TURBINATE
Anesthesia: General | Site: Nose | Laterality: Bilateral

## 2016-07-08 MED ORDER — LIDOCAINE HCL (CARDIAC) 20 MG/ML IV SOLN
INTRAVENOUS | Status: DC | PRN
  Administered 2016-07-08: 20 mg via INTRAVENOUS

## 2016-07-08 MED ORDER — MIDAZOLAM HCL 2 MG/2ML IJ SOLN
INTRAMUSCULAR | Status: AC
  Filled 2016-07-08: qty 2

## 2016-07-08 MED ORDER — OXYMETAZOLINE HCL 0.05 % NA SOLN
2.0000 | NASAL | Status: AC | PRN
  Administered 2016-07-08 (×2): 2 via NASAL
  Filled 2016-07-08: qty 15

## 2016-07-08 MED ORDER — OXYMETAZOLINE HCL 0.05 % NA SOLN
NASAL | Status: AC
  Filled 2016-07-08: qty 15

## 2016-07-08 MED ORDER — PROPOFOL 10 MG/ML IV BOLUS
INTRAVENOUS | Status: AC
Start: 2016-07-08 — End: 2016-07-08
  Filled 2016-07-08: qty 20

## 2016-07-08 MED ORDER — FENTANYL CITRATE (PF) 100 MCG/2ML IJ SOLN
INTRAMUSCULAR | Status: AC
  Filled 2016-07-08: qty 2

## 2016-07-08 MED ORDER — PROMETHAZINE HCL 25 MG/ML IJ SOLN: 6.2500 mg | INTRAMUSCULAR | Status: DC | PRN

## 2016-07-08 MED ORDER — LACTATED RINGERS IV SOLN
INTRAVENOUS | Status: DC | PRN
  Administered 2016-07-08: 08:00:00 via INTRAVENOUS

## 2016-07-08 MED ORDER — ONDANSETRON HCL 4 MG PO TABS: 4.0000 mg | ORAL_TABLET | ORAL | Status: DC | PRN

## 2016-07-08 MED ORDER — LIDOCAINE-EPINEPHRINE (PF) 1 %-1:200000 IJ SOLN
INTRAMUSCULAR | Status: AC
  Filled 2016-07-08: qty 30

## 2016-07-08 MED ORDER — DEXTROSE-NACL 5-0.45 % IV SOLN: INTRAVENOUS | Status: DC

## 2016-07-08 MED ORDER — LABETALOL HCL 5 MG/ML IV SOLN
5.0000 mg | INTRAVENOUS | Status: DC | PRN
  Administered 2016-07-08 (×2): 5 mg via INTRAVENOUS

## 2016-07-08 MED ORDER — BACITRACIN ZINC 500 UNIT/GM EX OINT
TOPICAL_OINTMENT | CUTANEOUS | Status: DC | PRN
  Administered 2016-07-08: 1 via TOPICAL

## 2016-07-08 MED ORDER — HYDROCODONE-ACETAMINOPHEN 5-325 MG PO TABS
1.0000 | ORAL_TABLET | ORAL | Status: DC | PRN
  Administered 2016-07-08: 2 via ORAL

## 2016-07-08 MED ORDER — ONDANSETRON HCL 4 MG/2ML IJ SOLN
INTRAMUSCULAR | Status: AC
  Filled 2016-07-08: qty 2

## 2016-07-08 MED ORDER — CEPHALEXIN 500 MG PO CAPS: 500.0000 mg | ORAL_CAPSULE | Freq: Four times a day (QID) | ORAL | Status: DC

## 2016-07-08 MED ORDER — ONDANSETRON HCL 4 MG/2ML IJ SOLN
INTRAMUSCULAR | Status: DC | PRN
  Administered 2016-07-08: 4 mg via INTRAVENOUS

## 2016-07-08 MED ORDER — LABETALOL HCL 5 MG/ML IV SOLN
INTRAVENOUS | Status: AC
  Filled 2016-07-08: qty 4

## 2016-07-08 MED ORDER — LIDOCAINE 2% (20 MG/ML) 5 ML SYRINGE
INTRAMUSCULAR | Status: AC
  Filled 2016-07-08: qty 5

## 2016-07-08 MED ORDER — MEPERIDINE HCL 25 MG/ML IJ SOLN: 6.2500 mg | INTRAMUSCULAR | Status: DC | PRN

## 2016-07-08 MED ORDER — IBUPROFEN 100 MG/5ML PO SUSP: 400.0000 mg | Freq: Four times a day (QID) | ORAL | Status: DC | PRN

## 2016-07-08 MED ORDER — LIDOCAINE-EPINEPHRINE 2 %-1:100000 IJ SOLN
INTRAMUSCULAR | Status: AC
  Filled 2016-07-08: qty 1

## 2016-07-08 MED ORDER — FENTANYL CITRATE (PF) 100 MCG/2ML IJ SOLN
INTRAMUSCULAR | Status: DC | PRN
  Administered 2016-07-08 (×2): 100 ug via INTRAVENOUS

## 2016-07-08 MED ORDER — MIDAZOLAM HCL 5 MG/5ML IJ SOLN
INTRAMUSCULAR | Status: DC | PRN
  Administered 2016-07-08: 2 mg via INTRAVENOUS

## 2016-07-08 MED ORDER — PROPOFOL 10 MG/ML IV BOLUS
INTRAVENOUS | Status: DC | PRN
  Administered 2016-07-08: 150 mg via INTRAVENOUS
  Administered 2016-07-08: 30 mg via INTRAVENOUS

## 2016-07-08 MED ORDER — MIDAZOLAM HCL 2 MG/2ML IJ SOLN: 0.5000 mg | Freq: Once | INTRAMUSCULAR | Status: DC | PRN

## 2016-07-08 MED ORDER — OXYMETAZOLINE HCL 0.05 % NA SOLN
NASAL | Status: DC | PRN
  Administered 2016-07-08: 1

## 2016-07-08 MED ORDER — HYDROMORPHONE HCL 1 MG/ML IJ SOLN
INTRAMUSCULAR | Status: AC
  Filled 2016-07-08: qty 1

## 2016-07-08 MED ORDER — ROCURONIUM BROMIDE 100 MG/10ML IV SOLN
INTRAVENOUS | Status: DC | PRN
  Administered 2016-07-08: 50 mg via INTRAVENOUS

## 2016-07-08 MED ORDER — BACITRACIN ZINC 500 UNIT/GM EX OINT
TOPICAL_OINTMENT | CUTANEOUS | Status: AC
Start: 2016-07-08 — End: 2016-07-08
  Filled 2016-07-08: qty 28.35

## 2016-07-08 MED ORDER — HYDROCODONE-ACETAMINOPHEN 5-325 MG PO TABS
ORAL_TABLET | ORAL | Status: AC
  Filled 2016-07-08: qty 2

## 2016-07-08 MED ORDER — DEXAMETHASONE SODIUM PHOSPHATE 10 MG/ML IJ SOLN
INTRAMUSCULAR | Status: DC | PRN
  Administered 2016-07-08: 10 mg via INTRAVENOUS

## 2016-07-08 MED ORDER — ROCURONIUM BROMIDE 50 MG/5ML IV SOSY
PREFILLED_SYRINGE | INTRAVENOUS | Status: AC
  Filled 2016-07-08: qty 5

## 2016-07-08 MED ORDER — LACTATED RINGERS IV SOLN
INTRAVENOUS | Status: DC
Start: 2016-07-08 — End: 2016-07-08
  Administered 2016-07-08: 08:00:00 via INTRAVENOUS

## 2016-07-08 MED ORDER — SUGAMMADEX SODIUM 500 MG/5ML IV SOLN
INTRAVENOUS | Status: DC | PRN
  Administered 2016-07-08: 444 mg via INTRAVENOUS

## 2016-07-08 MED ORDER — 0.9 % SODIUM CHLORIDE (POUR BTL) OPTIME
TOPICAL | Status: DC | PRN
  Administered 2016-07-08: 1000 mL

## 2016-07-08 MED ORDER — CEFAZOLIN SODIUM-DEXTROSE 2-4 GM/100ML-% IV SOLN
2.0000 g | INTRAVENOUS | Status: AC
  Administered 2016-07-08: 2 g via INTRAVENOUS
  Filled 2016-07-08: qty 100

## 2016-07-08 MED ORDER — HYDROMORPHONE HCL 1 MG/ML IJ SOLN
INTRAMUSCULAR | Status: AC
  Filled 2016-07-08: qty 0.5

## 2016-07-08 MED ORDER — ONDANSETRON HCL 4 MG/2ML IJ SOLN: 4.0000 mg | INTRAMUSCULAR | Status: DC | PRN

## 2016-07-08 MED ORDER — LIDOCAINE-EPINEPHRINE (PF) 1 %-1:200000 IJ SOLN
INTRAMUSCULAR | Status: DC | PRN
  Administered 2016-07-08: 13 mL

## 2016-07-08 MED ORDER — HYDROMORPHONE HCL 1 MG/ML IJ SOLN
0.2500 mg | INTRAMUSCULAR | Status: DC | PRN
  Administered 2016-07-08 (×3): 0.5 mg via INTRAVENOUS

## 2016-07-08 SURGICAL SUPPLY — 49 items
BLADE SURG 15 STRL LF DISP TIS (BLADE) ×1 IMPLANT
BLADE SURG 15 STRL SS (BLADE) ×1
CANISTER SUCT 3000ML PPV (MISCELLANEOUS) ×2 IMPLANT
CLEANER TIP ELECTROSURG 2X2 (MISCELLANEOUS) ×2 IMPLANT
COAGULATOR SUCT 8FR VV (MISCELLANEOUS) ×2 IMPLANT
COVER SURGICAL LIGHT HANDLE (MISCELLANEOUS) ×2 IMPLANT
DRAPE PROXIMA HALF (DRAPES) IMPLANT
DRESSING NASAL KENNEDY 3.5X.9 (MISCELLANEOUS) IMPLANT
DRESSING TELFA 8X10 (GAUZE/BANDAGES/DRESSINGS) ×2 IMPLANT
DRSG NASAL KENNEDY 3.5X.9 (MISCELLANEOUS)
ELECT COATED BLADE 2.86 ST (ELECTRODE) IMPLANT
ELECT REM PT RETURN 9FT ADLT (ELECTROSURGICAL)
ELECTRODE REM PT RTRN 9FT ADLT (ELECTROSURGICAL) IMPLANT
GAUZE PACKING FOLDED 2  STR (GAUZE/BANDAGES/DRESSINGS) ×1
GAUZE PACKING FOLDED 2 STR (GAUZE/BANDAGES/DRESSINGS) ×1 IMPLANT
GAUZE SPONGE 2X2 8PLY STRL LF (GAUZE/BANDAGES/DRESSINGS) ×1 IMPLANT
GLOVE BIO SURGEON STRL SZ 6.5 (GLOVE) ×2 IMPLANT
GLOVE BIOGEL PI IND STRL 6.5 (GLOVE) ×1 IMPLANT
GLOVE BIOGEL PI INDICATOR 6.5 (GLOVE) ×1
GLOVE SS BIOGEL STRL SZ 7.5 (GLOVE) ×2 IMPLANT
GLOVE SUPERSENSE BIOGEL SZ 7.5 (GLOVE) ×2
GLOVE SURG SS PI 6.5 STRL IVOR (GLOVE) ×2 IMPLANT
GOWN STRL REUS W/ TWL LRG LVL3 (GOWN DISPOSABLE) ×1 IMPLANT
GOWN STRL REUS W/ TWL XL LVL3 (GOWN DISPOSABLE) ×1 IMPLANT
GOWN STRL REUS W/TWL LRG LVL3 (GOWN DISPOSABLE) ×1
GOWN STRL REUS W/TWL XL LVL3 (GOWN DISPOSABLE) ×1
KIT BASIN OR (CUSTOM PROCEDURE TRAY) ×2 IMPLANT
KIT ROOM TURNOVER OR (KITS) ×2 IMPLANT
NEEDLE HYPO 25GX1X1/2 BEV (NEEDLE) ×2 IMPLANT
NEEDLE PRECISIONGLIDE 27X1.5 (NEEDLE) ×2 IMPLANT
NEEDLE SPNL 25GX3.5 QUINCKE BL (NEEDLE) ×4 IMPLANT
NS IRRIG 1000ML POUR BTL (IV SOLUTION) ×2 IMPLANT
PAD ARMBOARD 7.5X6 YLW CONV (MISCELLANEOUS) ×4 IMPLANT
PATTIES SURGICAL .5 X3 (DISPOSABLE) ×2 IMPLANT
PENCIL FOOT CONTROL (ELECTRODE) IMPLANT
SPLINT NASAL DOYLE BI-VL (GAUZE/BANDAGES/DRESSINGS) IMPLANT
SPONGE GAUZE 2X2 STER 10/PKG (GAUZE/BANDAGES/DRESSINGS) ×1
STRIP CLOSURE SKIN 1/2X4 (GAUZE/BANDAGES/DRESSINGS) IMPLANT
SURGILUBE 2OZ TUBE FLIPTOP (MISCELLANEOUS) ×2 IMPLANT
SUT CHROMIC 3 0 PS 2 (SUTURE) IMPLANT
SUT CHROMIC 4 0 PS 2 18 (SUTURE) ×2 IMPLANT
SUT CHROMIC 5 0 P 3 (SUTURE) IMPLANT
SUT ETHILON 3 0 PS 1 (SUTURE) IMPLANT
SUT ETHILON 4 0 PS 2 18 (SUTURE) IMPLANT
SUT ETHILON 5 0 P 3 18 (SUTURE)
SUT NYLON ETHILON 5-0 P-3 1X18 (SUTURE) IMPLANT
SUT SILK 2 0 FS (SUTURE) IMPLANT
TRAY ENT MC OR (CUSTOM PROCEDURE TRAY) ×2 IMPLANT
WATER STERILE IRR 1000ML POUR (IV SOLUTION) IMPLANT

## 2016-07-08 NOTE — Transfer of Care (Signed)
Immediate Anesthesia Transfer of Care Note  Patient: Arthur Cruz  Procedure(s) Performed: Procedure(s): INFERIOR TURBINATE REDUCTION (Bilateral)  Patient Location: PACU  Anesthesia Type:General  Level of Consciousness: awake  Airway & Oxygen Therapy: Patient Spontanous Breathing  Post-op Assessment: Report given to RN and Post -op Vital signs reviewed and stable  Post vital signs: Reviewed and stable  Last Vitals:  Vitals:   07/08/16 0726  BP: (!) 136/93  Pulse: 81  Resp: 20  Temp: 37 C    Last Pain: There were no vitals filed for this visit.    Patients Stated Pain Goal: 2 (07/08/16 [id number])  Complications: No apparent anesthesia complications

## 2016-07-08 NOTE — H&P (Signed)
Arthur Cruz, Arthur Cruz 46 y.o., male [id number]     Chief Complaint: snoring, OSA  HPI: 46 yo wm, long hx snoring.  AHI 12 recently.  Large nasal turbinates. 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  Dislikes CPAP.  PMH: Past Medical History:  Diagnosis Date  . Depression    situation depression  . Flu due to oth ident flu virus w unsp type of pneumonia    within last 5 weeks/medically cleared  . Hepatitis A 04/2016   Recent diagnosis (within 5 weeks)  . Pneumonia 05/2016  . Sleep apnea    recent diagnosis    Surg Hx: uvulectomy  FHx:  History reviewed. No pertinent family history. SocHx:  reports that he has never smoked. He has never used smokeless tobacco. He reports that he drinks about 1.2 - 1.8 oz of alcohol per week . He reports that he does not use drugs.  ALLERGIES:  Allergies  Allergen Reactions  . No Known Allergies     Medications Prior to Admission  Medication Sig Dispense Refill  . buPROPion 450 MG TB24 Take 450 mg by mouth daily. 30 tablet 0  . fluticasone (FLONASE) 50 MCG/ACT nasal spray Place 1 spray into both nostrils at bedtime.    . Multiple Vitamin (MULTIVITAMIN WITH MINERALS) TABS tablet Take 1 tablet by mouth daily.    . traZODone (DESYREL) 50 MG tablet Take 1 tablet (50 mg total) by mouth at bedtime and may repeat dose one time if needed. (Patient not taking: Reported on 07/06/2016) 30 tablet 0    Results for orders placed or performed during the hospital encounter of 07/07/16 (from the past 48 hour(s))  Basic metabolic panel     Status: Abnormal   Collection Time: 07/07/16  8:36 AM  Result Value Ref Range   Sodium 138 135 - 145 mmol/L   Potassium 4.1 3.5 - 5.1 mmol/L   Chloride 104 101 - 111 mmol/L   CO2 22 22 - 32 mmol/L   Glucose, Bld 140 (H) 65 - 99 mg/dL   BUN 13 6 - 20 mg/dL   Creatinine, Ser 0.94 0.61 - 1.24 mg/dL   Calcium 9.4 8.9 - 10.3 mg/dL   GFR calc non Af Amer >60 >60 mL/min   GFR calc Af Amer >60 >60 mL/min   Comment: (NOTE) The eGFR has been calculated using the CKD EPI equation. This calculation has not been validated in all clinical situations. eGFR's persistently <60 mL/min signify possible Chronic Kidney Disease.    Anion gap 12 5 - 15  CBC     Status: None   Collection Time: 07/07/16  8:36 AM  Result Value Ref Range   WBC 6.6 4.0 - 10.5 K/uL   RBC 5.53 4.22 - 5.81 MIL/uL   Hemoglobin 16.1 13.0 - 17.0 g/dL   HCT 47.6 39.0 - 52.0 %   MCV 86.1 78.0 - 100.0 fL   MCH 29.1 26.0 - 34.0 pg   MCHC 33.8 30.0 - 36.0 g/dL   RDW 13.5 11.5 - 15.5 %   Platelets 232 150 - 400 K/uL   No results found.  ROS:  No chest pain, SOB.  Blood pressure (!) 136/93, pulse 81, temperature 98.6 F (37 C), resp. rate 20, height 5' 11"  (1.803 m), weight 110.9 kg (244 lb 9.6 oz), SpO2 97 %.  PHYSICAL EXAM: Overall appearance:  Stocky, healthy Head:  NCAT Ears:  clear Nose:  Straight septum. Bulky inferior turbinates Oral Cavity:  Sl bulky tongue Oral  Pharynx/Hypopharynx/Larynx:  Long thin soft palate, absent uvula Neuro: intact Neck:  Clear Lungs: clear to A Heart:  RRR, no murmurs Abd:  Soft active Ext:  Nl config  Studies Reviewed:NPSG    Assessment/Plan Mild-mod OSA.  Bulky inferior turbinates.  Plan:  SMR inferior turbinates under GA.  Jodi Marble 1/95/0932, 8:58 AM

## 2016-07-08 NOTE — Anesthesia Procedure Notes (Signed)
Procedure Name: Intubation Date/Time: 07/08/2016 9:14 AM Performed by: Manus Gunning, Margi Edmundson J Pre-anesthesia Checklist: Patient identified, Emergency Drugs available, Suction available, Patient being monitored and Timeout performed Patient Re-evaluated:Patient Re-evaluated prior to inductionOxygen Delivery Method: Circle system utilized Preoxygenation: Pre-oxygenation with 100% oxygen Intubation Type: IV induction Ventilation: Mask ventilation without difficulty Laryngoscope Size: Mac and 4 Tube type: Oral Tube size: 7.5 mm Number of attempts: 1 Placement Confirmation: ETT inserted through vocal cords under direct vision,  positive ETCO2 and breath sounds checked- equal and bilateral Secured at: 22 cm Tube secured with: Tape Dental Injury: Teeth and Oropharynx as per pre-operative assessment

## 2016-07-08 NOTE — Discharge Instructions (Signed)
Drip pad and change as needed OK to rinse throat with cool dilute salt water to clear old blood and thick phlegm If you do not stay tonight, I will remove the packing in my office tomorrow.  Take a dose of your pain medication before coming in for that visit.  If you stay tonight, I will remove the packs before you go home tomorrow. Sleep with head elevated 3-4 nights As soon as the packing is out of your nose, begin your nasal hygiene measures liberally Call for active bleeding, excessive pain [phone]

## 2016-07-08 NOTE — Anesthesia Postprocedure Evaluation (Signed)
Anesthesia Post Note  Patient: Archer Asaristan Urizar  Procedure(s) Performed: Procedure(s) (LRB): INFERIOR TURBINATE REDUCTION (Bilateral)  Patient location during evaluation: PACU Anesthesia Type: General Level of consciousness: awake and alert, oriented and patient cooperative Pain management: pain level controlled Vital Signs Assessment: post-procedure vital signs reviewed and stable Respiratory status: spontaneous breathing, nonlabored ventilation and respiratory function stable Cardiovascular status: blood pressure returned to baseline and stable Postop Assessment: no signs of nausea or vomiting Anesthetic complications: no       Last Vitals:  Vitals:   07/08/16 1345 07/08/16 1400  BP: 115/72 118/71  Pulse: 69 73  Resp: 10 11  Temp:      Last Pain:  Vitals:   07/08/16 1430  PainSc: 3                  Aspen Lawrance,E. Idaliz Tinkle

## 2016-07-08 NOTE — Anesthesia Preprocedure Evaluation (Signed)
Anesthesia Evaluation  Patient identified by MRN, date of birth, ID band Patient awake    Reviewed: Allergy & Precautions, NPO status , Patient's Chart, lab work & pertinent test results  History of Anesthesia Complications Negative for: history of anesthetic complications  Airway Mallampati: II  TM Distance: >3 FB Neck ROM: Full    Dental  (+) Dental Advisory Given   Pulmonary sleep apnea (does not use CPAP) , pneumonia, resolved,    breath sounds clear to auscultation       Cardiovascular negative cardio ROS   Rhythm:Regular Rate:Normal     Neuro/Psych negative neurological ROS     GI/Hepatic negative GI ROS, Neg liver ROS,   Endo/Other  Morbid obesity  Renal/GU negative Renal ROS     Musculoskeletal   Abdominal (+) + obese,   Peds  Hematology negative hematology ROS (+)   Anesthesia Other Findings   Reproductive/Obstetrics                             Anesthesia Physical Anesthesia Plan  ASA: III  Anesthesia Plan: General   Post-op Pain Management:    Induction: Intravenous  Airway Management Planned: Oral ETT  Additional Equipment:   Intra-op Plan:   Post-operative Plan: Extubation in OR  Informed Consent: I have reviewed the patients History and Physical, chart, labs and discussed the procedure including the risks, benefits and alternatives for the proposed anesthesia with the patient or authorized representative who has indicated his/her understanding and acceptance.   Dental advisory given  Plan Discussed with: CRNA and Surgeon  Anesthesia Plan Comments: (Plan routine monitors, GETA)        Anesthesia Quick Evaluation

## 2016-07-08 NOTE — Op Note (Signed)
07/08/2016  10:04 AM    [name], Arthur  [id number]   Pre-Op Dx:   Hypertrophic Inferior Turbinates  Post-op Dx: Same  Proc:  Bilateral SMR Inferior Turbinates   Surg:  Flo ShanksWOLICKI, Latrelle Bazar T MD  Anes:  GOT  EBL:  10 ml  Comp:   none  Findings:  Bulky soft tissue and bony inferior turbinates.  Sl LEFTward septal deviation.  Procedure: With the patient in a comfortable supine position,  general orotracheal anesthesia was induced without difficulty.     The patient received preoperative Afrin spray for topical decongestion and vasoconstriction.  Intravenous prophylactic antibiotics were administered.  At an appropriate level, the patient was placed in a semi-sitting position.  A saline moistened throat pack was placed.  Nasal vibrissae were trimmed.   Afrin  solution was applied on 0.5" x 3" cottonoids to both sides of the septal mucosa.   1% Xylocaine with 1:100,000 epinephrine, 7 cc's, was infiltrated into into the submucoperichondrial plane of the septum on both sides and into the anterior poles of the inferior turbinates on both sides..  Several minutes were allowed for this to take effect.  A sterile preparation and draping of the midface was accomplished in the standard fashion.  The materials were removed from the nose and observed to be intact and correct in number.  The nose was inspected with a headlight with the findings as described above.    Additional 1% xylocaine with 1:200,000 epinephrine, 6 ml was infiltrated into both inferior turbinates.  Several more minutes were allowed for this to take effect.     beginning on the RIGHT side, the inferior turbinate was inspected and infractured.  The anterior hood of the inferior turbinate was sharply lysed just behind the nasal valve.  The medial mucosa of the inferior turbinate was incised in an  anterior upsloping fashion and a laterally based flap was developed from the turbinate bone.  Using angled turbinate scissors,  turbinate bone and lateral mucosa were resected in a posterior downsloping fashion, taking much of the anterior pole and leaving most of the posterior pole.  Bony spicules were submucosally dissected and removed.  The mucosal flap was laid back down and the turbinate was outfractured.  This completed one SMR inferior turbinate.  The opposite side was performed in identical fashion.  The cut mucosal edges were suction coagulated on both sides for hemostasis.  Again hemostasis was observed.  Bacitracin impregnated Telfa packs were placed against the inferior turbinates on each side.  A 6.5 mm nasal trumpet was shortened to just reach the nasopharynx and placed between the septum and the Telfa pack on each side.      At this point the procedure was completed.  The pharynx was suctioned free and the throat pack was removed.   The patient was returned to anesthesia, awakened, extubated, and transferred to recovery in stable condition.  Dispo:   PACU to 6 hr observation, possible overnight observation given OSA.  Plan: Ice, elevation, narcotic analgesia, prophylactic antibiotics for the duration of indwelling nasal foreign bodies.  We will remove the nasal packing In one day.  Return to work or school in 7 days, strenuous activities in two weeks.  Cephus RicherWOLICKI,  Siddhanth Denk T MD

## 2016-07-09 ENCOUNTER — Encounter (HOSPITAL_COMMUNITY): Payer: Self-pay | Admitting: Otolaryngology

## 2016-07-13 ENCOUNTER — Other Ambulatory Visit (HOSPITAL_COMMUNITY): Payer: Self-pay | Admitting: Psychiatry

## 2019-01-19 IMAGING — CR DG ABDOMEN ACUTE W/ 1V CHEST
4 series · 4 of 4 positions shown · non-contrast
Comparison: Chest Radiograph dated 06/10/2016

CLINICAL DATA: 45-year-old male with abdominal pain.

EXAM:
DG ABDOMEN ACUTE W/ 1V CHEST

[chest pa]
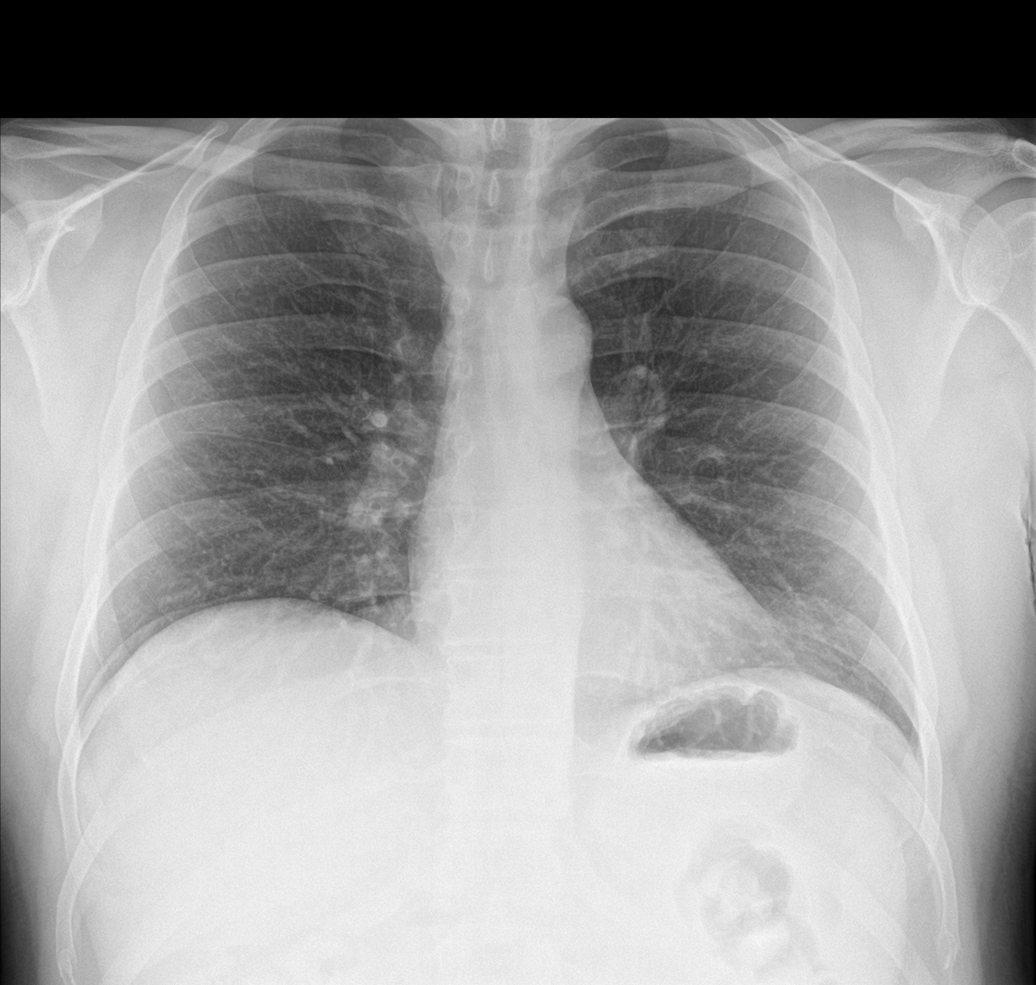

[abdomen erect]
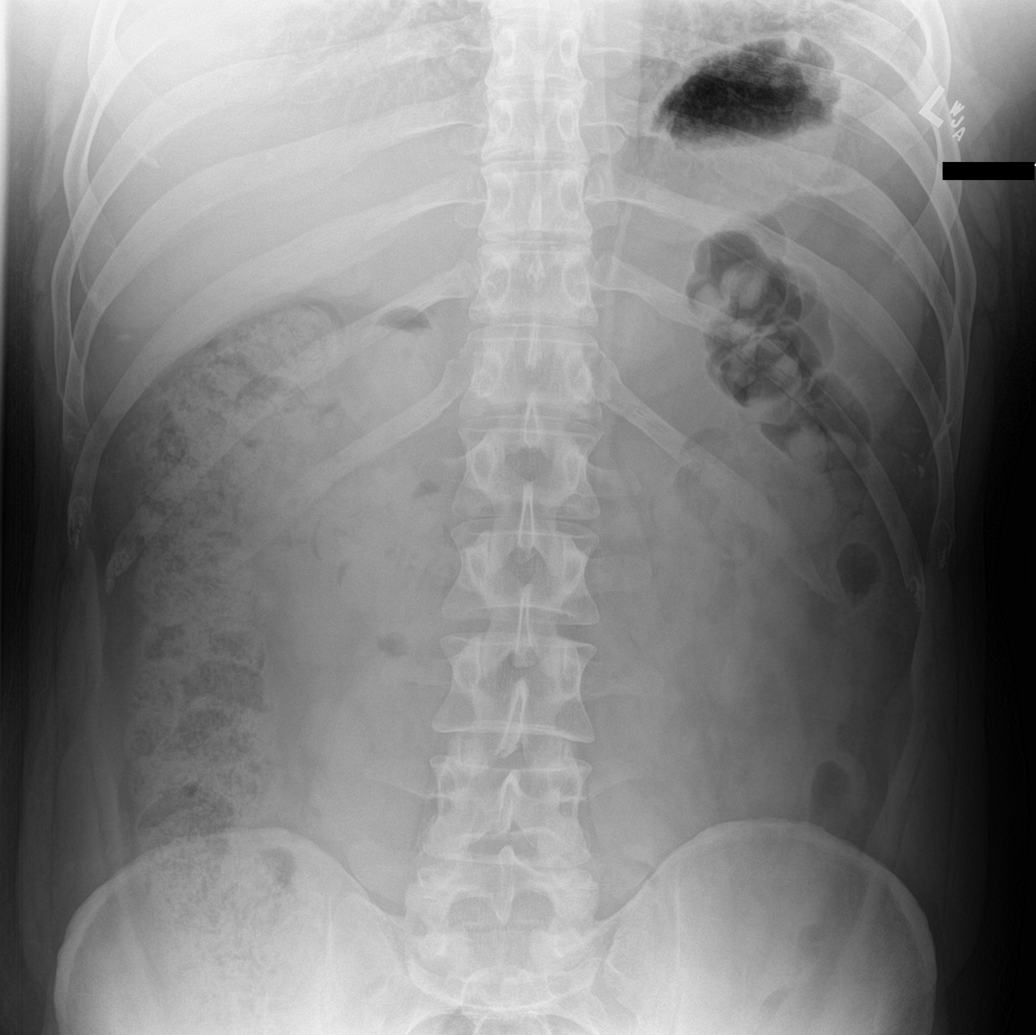

[abdomen supine (1 of 2)]
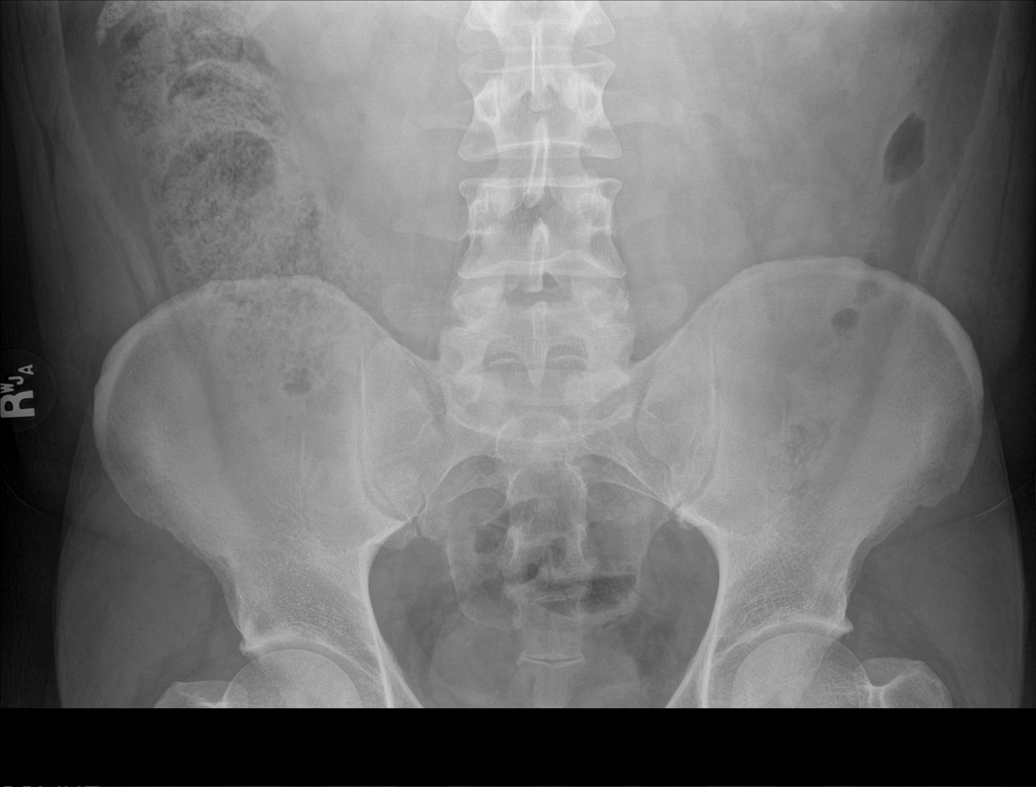

[abdomen supine (2 of 2)]
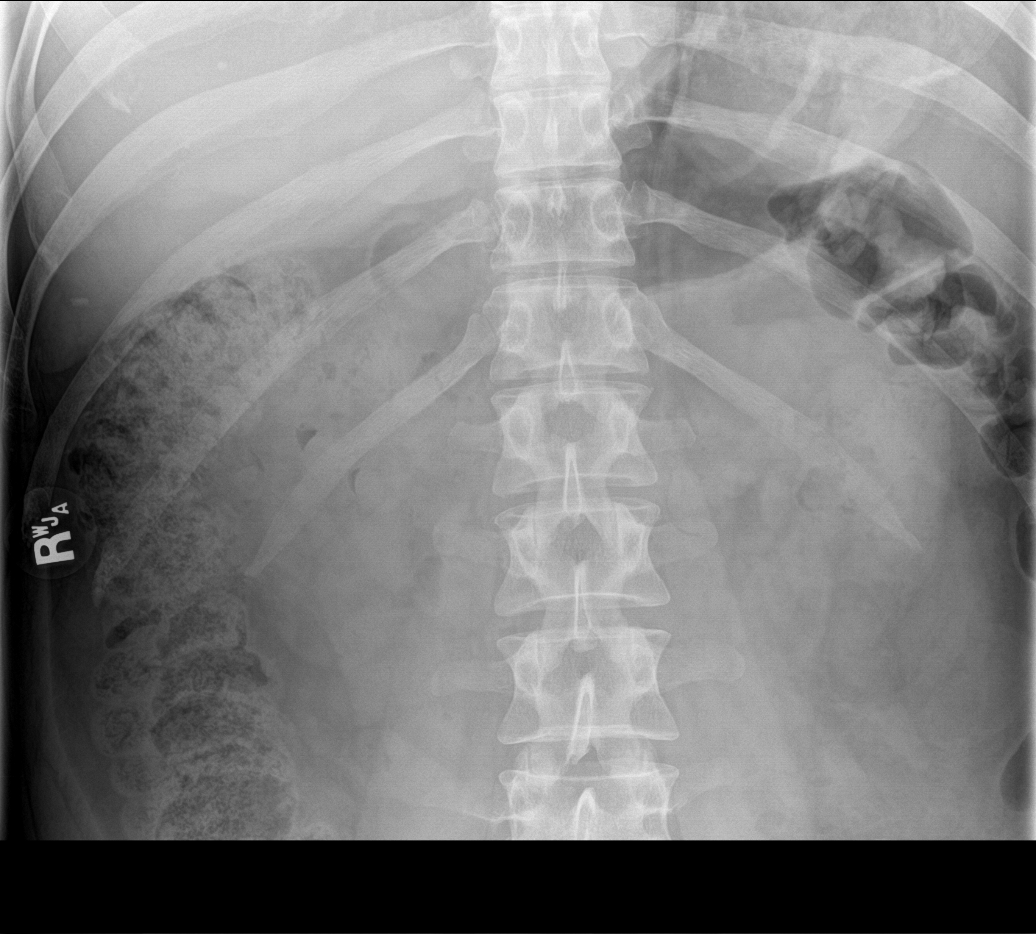

[4 of 4 positions shown; findings below may reference images not displayed]

FINDINGS: The lungs are clear. There is no pleural effusion or pneumothorax.
The cardiac silhouette is within normal limits.

There is moderate stool throughout the colon. No bowel dilatation or
evidence of obstruction. No free air or radiopaque calculi
identified. The visualized osseous structures and the soft tissues
appear unremarkable.
IMPRESSION: 1. No acute cardiopulmonary process.
2. Constipation.  No bowel obstruction.
